# Patient Record
Sex: Male | Born: 1945 | Race: White | Hispanic: No | Marital: Married | Smoking: Never smoker
Health system: Southern US, Community
[De-identification: ages and names within clinical notes are randomized; demographics above are authoritative.]

## PROBLEM LIST (undated history)

## (undated) DIAGNOSIS — C801 Malignant (primary) neoplasm, unspecified: Secondary | ICD-10-CM

## (undated) DIAGNOSIS — T17908A Unspecified foreign body in respiratory tract, part unspecified causing other injury, initial encounter: Secondary | ICD-10-CM

## (undated) DIAGNOSIS — K219 Gastro-esophageal reflux disease without esophagitis: Secondary | ICD-10-CM

## (undated) DIAGNOSIS — E039 Hypothyroidism, unspecified: Secondary | ICD-10-CM

## (undated) HISTORY — PX: GASTROSTOMY TUBE PLACEMENT: SHX655

## (undated) HISTORY — PX: TONSILLECTOMY: SUR1361

## (undated) HISTORY — PX: OTHER SURGICAL HISTORY: SHX169

---

## 2018-09-07 ENCOUNTER — Inpatient Hospital Stay
Admission: EM | Admit: 2018-09-07 | Discharge: 2018-09-09 | DRG: 871 | Disposition: A | Discharge status: Home / Self Care | Payer: Medicare Other | Attending: Specialist | Admitting: Specialist

## 2018-09-07 ENCOUNTER — Encounter: Payer: Self-pay | Admitting: Emergency Medicine

## 2018-09-07 ENCOUNTER — Emergency Department: Payer: Medicare Other

## 2018-09-07 DIAGNOSIS — Z79899 Other long term (current) drug therapy: Secondary | ICD-10-CM

## 2018-09-07 DIAGNOSIS — K219 Gastro-esophageal reflux disease without esophagitis: Secondary | ICD-10-CM | POA: Diagnosis present

## 2018-09-07 DIAGNOSIS — J9601 Acute respiratory failure with hypoxia: Secondary | ICD-10-CM

## 2018-09-07 DIAGNOSIS — J690 Pneumonitis due to inhalation of food and vomit: Secondary | ICD-10-CM

## 2018-09-07 DIAGNOSIS — Z79890 Hormone replacement therapy (postmenopausal): Secondary | ICD-10-CM

## 2018-09-07 DIAGNOSIS — R1310 Dysphagia, unspecified: Secondary | ICD-10-CM | POA: Diagnosis present

## 2018-09-07 DIAGNOSIS — E039 Hypothyroidism, unspecified: Secondary | ICD-10-CM | POA: Diagnosis present

## 2018-09-07 DIAGNOSIS — K224 Dyskinesia of esophagus: Secondary | ICD-10-CM | POA: Diagnosis present

## 2018-09-07 DIAGNOSIS — A419 Sepsis, unspecified organism: Secondary | ICD-10-CM | POA: Diagnosis not present

## 2018-09-07 DIAGNOSIS — E872 Acidosis: Secondary | ICD-10-CM | POA: Diagnosis present

## 2018-09-07 DIAGNOSIS — R55 Syncope and collapse: Secondary | ICD-10-CM | POA: Diagnosis present

## 2018-09-07 DIAGNOSIS — R4702 Dysphasia: Secondary | ICD-10-CM | POA: Diagnosis present

## 2018-09-07 HISTORY — DX: Hypothyroidism, unspecified: E03.9

## 2018-09-07 HISTORY — DX: Gastro-esophageal reflux disease without esophagitis: K21.9

## 2018-09-07 LAB — CBC WITH DIFFERENTIAL/PLATELET
Abs Immature Granulocytes: 0.04 10*3/uL (ref 0.00–0.07)
Basophils Absolute: 0 10*3/uL (ref 0.0–0.1)
Basophils Relative: 0 %
Eosinophils Absolute: 0.3 10*3/uL (ref 0.0–0.5)
Eosinophils Relative: 2 %
HEMATOCRIT: 42 % (ref 39.0–52.0)
Hemoglobin: 13.6 g/dL (ref 13.0–17.0)
Immature Granulocytes: 0 %
LYMPHS PCT: 4 %
Lymphs Abs: 0.6 10*3/uL — ABNORMAL LOW (ref 0.7–4.0)
MCH: 29.1 pg (ref 26.0–34.0)
MCHC: 32.4 g/dL (ref 30.0–36.0)
MCV: 89.9 fL (ref 80.0–100.0)
Monocytes Absolute: 0.3 10*3/uL (ref 0.1–1.0)
Monocytes Relative: 2 %
Neutro Abs: 12.8 10*3/uL — ABNORMAL HIGH (ref 1.7–7.7)
Neutrophils Relative %: 92 %
Platelets: 376 10*3/uL (ref 150–400)
RBC: 4.67 MIL/uL (ref 4.22–5.81)
RDW: 13.4 % (ref 11.5–15.5)
WBC: 14 10*3/uL — AB (ref 4.0–10.5)
nRBC: 0 % (ref 0.0–0.2)

## 2018-09-07 LAB — BASIC METABOLIC PANEL
Anion gap: 10 (ref 5–15)
BUN: 20 mg/dL (ref 8–23)
CO2: 25 mmol/L (ref 22–32)
Calcium: 8.5 mg/dL — ABNORMAL LOW (ref 8.9–10.3)
Chloride: 98 mmol/L (ref 98–111)
Creatinine, Ser: 1.23 mg/dL (ref 0.61–1.24)
GFR calc Af Amer: >60 mL/min (ref >60)
GFR calc non Af Amer: 59 mL/min — ABNORMAL LOW (ref >60)
Glucose, Bld: 127 mg/dL — ABNORMAL HIGH (ref 70–99)
Potassium: 4 mmol/L (ref 3.5–5.1)
Sodium: 133 mmol/L — ABNORMAL LOW (ref 135–145)

## 2018-09-07 LAB — TROPONIN I: Troponin I: <0.03 ng/mL (ref <0.03)

## 2018-09-07 LAB — HEPATIC FUNCTION PANEL
ALT: 19 U/L (ref 0–44)
AST: 31 U/L (ref 15–41)
Albumin: 3.3 g/dL — ABNORMAL LOW (ref 3.5–5.0)
Alkaline Phosphatase: 71 U/L (ref 38–126)
Bilirubin, Direct: 0.2 mg/dL (ref 0.0–0.2)
Indirect Bilirubin: 0.9 mg/dL (ref 0.3–0.9)
Total Bilirubin: 1.1 mg/dL (ref 0.3–1.2)
Total Protein: 8.1 g/dL (ref 6.5–8.1)

## 2018-09-07 LAB — LACTIC ACID, PLASMA: LACTIC ACID, VENOUS: 2.8 mmol/L — AB (ref 0.5–1.9)

## 2018-09-07 LAB — BRAIN NATRIURETIC PEPTIDE: B Natriuretic Peptide: 57 pg/mL (ref 0.0–100.0)

## 2018-09-07 LAB — LIPASE, BLOOD: LIPASE: 29 U/L (ref 11–51)

## 2018-09-07 MED ORDER — SODIUM CHLORIDE 0.9 % IV BOLUS
1000.0000 mL | Freq: Once | INTRAVENOUS | Status: AC
  Administered 2018-09-07: 1000 mL via INTRAVENOUS

## 2018-09-07 MED ORDER — ONDANSETRON HCL 4 MG/2ML IJ SOLN
4.0000 mg | Freq: Once | INTRAMUSCULAR | Status: AC
  Administered 2018-09-07: 4 mg via INTRAVENOUS
  Filled 2018-09-07: qty 2

## 2018-09-07 MED ORDER — SODIUM CHLORIDE 0.9 % IV BOLUS
1000.0000 mL | Freq: Once | INTRAVENOUS | Status: AC
  Administered 2018-09-08: 1000 mL via INTRAVENOUS

## 2018-09-07 NOTE — ED Notes (Signed)
Pt placed on NRB @ 15L for O2 sat 80% on 6L via n/c

## 2018-09-07 NOTE — ED Triage Notes (Signed)
Patient states that he has had 2 syncopal episodes today. Patient states that he has also been vomiting after he passes out. Patient actively vomiting in triage. Patient with complaint of bilateral thigh pain after first syncopal episode.

## 2018-09-07 NOTE — ED Provider Notes (Signed)
[facility] Emergency Department Provider Note ____________________________________________   First MD Initiated Contact with Patient 09/07/18 2305     (approximate)  I have reviewed the triage vital signs and the nursing notes.   HISTORY  Chief Complaint Loss of Consciousness and Emesis    HPI Mark Andrews is a 74 y.o. male with PMH as noted below who presents with shortness of breath, vomiting, and leg pain.  The patient states that around 7 PM this evening he started to have some leg pain.  He then became very lightheaded and syncopized.  Per his wife, he began vomiting during the syncope.  He had a second episode of syncope a short time later, and also further vomiting while in triage here.  Since he passed out, he has been short of breath.  He also reports feeling weak with myalgias since this afternoon, and has had a nonproductive cough for several weeks.   Past Medical History:  Diagnosis Date  . GERD (gastroesophageal reflux disease)   . Hypothyroidism     Patient Active Problem List   Diagnosis Date Noted  . Sepsis ([place]) 09/08/2018    Past Surgical History:  Procedure Laterality Date  . none      Prior to Admission medications   Medication Sig Start Date End Date Taking? Authorizing Provider  levothyroxine (SYNTHROID, LEVOTHROID) 75 MCG tablet Take 75 mcg by mouth daily before breakfast.   Yes [provider]  pantoprazole (PROTONIX) 40 MG tablet Take 40 mg by mouth daily.   Yes [provider]  sodium fluoride (FLUORISHIELD) 1.1 % GEL dental gel Place 1 application onto teeth daily.   Yes [provider]    Allergies Patient has no known allergies.  Family History  Problem Relation Age of Onset  . Pulmonary embolism Mother        and DVT    Social History Social History   Tobacco Use  . Smoking status: Never Smoker  . Smokeless tobacco: Never Used  Substance Use Topics  . Alcohol use: Not on file   . Drug use: Not on file    Review of Systems  Constitutional: Positive for weakness. Eyes: No redness. ENT: No sore throat. Cardiovascular: Denies chest pain. Respiratory: Positive for shortness of breath. Gastrointestinal: Positive for vomiting. Genitourinary: Negative for flank pain.  Musculoskeletal: Positive for leg pain and myalgias. Skin: Negative for rash. Neurological: Negative for headache.   ____________________________________________   PHYSICAL EXAM:  VITAL SIGNS: ED Triage Vitals  Enc Vitals Group     BP 09/07/18 2255 105/71     Pulse Rate 09/07/18 2255 (!) 104     Resp 09/07/18 2255 (!) 26     Temp 09/07/18 2255 98 F (36.7 C)     Temp src --      SpO2 09/07/18 2255 (!) 79 %     Weight 09/07/18 2253 145 lb (65.8 kg)     Height 09/07/18 2253 5\' 10"  (1.778 m)     Head Circumference --      Peak Flow --      Pain Score 09/07/18 2253 8     Pain Loc --      Pain Edu? --      Excl. in [place]? --     Constitutional: Alert and oriented.  Uncomfortable and weak appearing but in no acute distress.   Eyes: Conjunctivae are normal.  Head: Atraumatic. Nose: No congestion/rhinnorhea. Mouth/Throat: Mucous membranes are somewhat dry.   Neck: Normal  range of motion.  Cardiovascular: Borderline tachycardic, regular rhythm. Grossly normal heart sounds.  Good peripheral circulation. Respiratory: Normal respiratory effort.  No retractions.  Slightly coarse breath sounds bilaterally but lungs otherwise clear. Gastrointestinal: No distention.  Genitourinary: No CVA tenderness. Musculoskeletal: No lower extremity edema.  Extremities warm and well perfused.  No calf or popliteal swelling or tenderness. Neurologic:  Normal speech and language. No gross focal neurologic deficits are appreciated.  Skin:  Skin is warm and dry. No rash noted. Psychiatric: Mood and affect are normal. Speech and behavior are normal.  ____________________________________________   LABS (all  labs ordered are listed, but only abnormal results are displayed)  Labs Reviewed  BASIC METABOLIC PANEL - Abnormal; Notable for the following components:      Result Value   Sodium 133 (*)    Glucose, Bld 127 (*)    Calcium 8.5 (*)    GFR calc non Af Amer 59 (*)    All other components within normal limits  CBC WITH DIFFERENTIAL/PLATELET - Abnormal; Notable for the following components:   WBC 14.0 (*)    Neutro Abs 12.8 (*)    Lymphs Abs 0.6 (*)    All other components within normal limits  LACTIC ACID, PLASMA - Abnormal; Notable for the following components:   Lactic Acid, Venous 2.8 (*)    All other components within normal limits  HEPATIC FUNCTION PANEL - Abnormal; Notable for the following components:   Albumin 3.3 (*)    All other components within normal limits  URINALYSIS, COMPLETE (UACMP) WITH MICROSCOPIC - Abnormal; Notable for the following components:   Color, Urine YELLOW (*)    APPearance CLEAR (*)    Specific Gravity, Urine 1.042 (*)    All other components within normal limits  TROPONIN I  LACTIC ACID, PLASMA  BRAIN NATRIURETIC PEPTIDE  LIPASE, BLOOD  INFLUENZA PANEL BY PCR (TYPE A & B)  TSH   ____________________________________________  EKG  ED ECG REPORT I, Arta Silence, the attending physician, personally viewed and interpreted this ECG.  Date: 09/07/2018 EKG Time: 2257 Rate: 101 Rhythm: Sinus tachycardia QRS Axis: Borderline right axis Intervals: normal ST/T Wave abnormalities: normal Narrative Interpretation: no evidence of acute ischemia  ____________________________________________  RADIOLOGY  CXR: Bilateral lower lobe opacity versus atelectasis CT chest: No acute PE ____________________________________________   PROCEDURES  Procedure(s) performed: No  Procedures  Critical Care performed: Yes  CRITICAL CARE Performed by: Arta Silence   Total critical care time: 60 minutes  Critical care time was exclusive of  separately billable procedures and treating other patients.  Critical care was necessary to treat or prevent imminent or life-threatening deterioration.  Critical care was time spent personally by me on the following activities: development of treatment plan with patient and/or surrogate as well as nursing, discussions with consultants, evaluation of patient's response to treatment, examination of patient, obtaining history from patient or surrogate, ordering and performing treatments and interventions, ordering and review of laboratory studies, ordering and review of radiographic studies, pulse oximetry and re-evaluation of patient's condition. ____________________________________________   INITIAL IMPRESSION / ASSESSMENT AND PLAN / ED COURSE  Pertinent labs & imaging results that were available during my care of the patient were reviewed by me and considered in my medical decision making (see chart for details).  73 year old male with no significant past medical history except for GERD presents with syncope x2 this evening associated with vomiting during the syncopal episodes, and with shortness of breath since that time.  The patient  also reports myalgias and bilateral leg pain.  He has had a chronic cough for several weeks.  I reviewed the past medical records in [place] but the patient has no prior visits here.  On exam the patient is alert, oriented, and able to converse normally.  However he does appear weak and somewhat tired.  On arrival his O2 saturation was in the high 70s on room air and his blood pressure was somewhat low.  He was also tachypneic, but afebrile.  The remainder of the exam is as described above.  There is no significant abnormality on his lung exam, his airway is clear, and his neuro exam is nonfocal.  Overall the presentation is most concerning for aspiration.  Symptoms are most consistent with influenza, another viral infection or possible pneumonia,  with subsequent syncope and likely aspiration due to the vomiting.  Given the patient's lack of history and the sequence of the symptoms and relatively unremarkable EKG, I have a low suspicion for cardiac etiology.  Another significant possibility, however, is pulmonary embolism, which would be consistent with the hypoxia, tachycardia, the presence of leg pain, and the syncope.  At this time we have placed the patient on BiPAP for oxygenation because his O2 saturation was only in the low 90s on nonrebreather.  We started fluids and will obtain lab work-up and chest x-ray.  My plan will be that if the chest x-ray does not show significant infiltrate or edema, I will proceed with CT to rule out PE.  ----------------------------------------- 1:40 AM on 09/08/2018 -----------------------------------------  Chest x-ray does show bilateral lower lobe atelectasis versus opacity, which is consistent with an early aspiration.  The patient's labs also reflect likely sepsis with elevated lactate and WBC count.  I started Unasyn for likely aspiration, and the patient is now receiving his third liter.  I was also able to wean the patient off of BiPAP and back onto nasal cannula and he is tolerating this well at this time.  However given the patient's persistent low blood pressure and the factors described above, I think it would be prudent prior to admitting him to rule out PE.  I ordered a CT chest.  I signed the patient out to the hospitalist Dr. Jannifer Franklin at approximately 1:40 AM. ____________________________________________   FINAL CLINICAL IMPRESSION(S) / ED DIAGNOSES  Final diagnoses:  Acute respiratory failure with hypoxia ([place])  Aspiration pneumonia of lower lobe, unspecified aspiration pneumonia type, unspecified laterality ([place])  Syncope, unspecified syncope type      NEW MEDICATIONS STARTED DURING THIS VISIT:  Current Discharge Medication List       Note:  This document was prepared using  Dragon voice recognition software and may include unintentional dictation errors.    Arta Silence, MD 09/08/18 [phone]

## 2018-09-07 NOTE — ED Notes (Signed)
RT paged for biPAP per EDP

## 2018-09-08 ENCOUNTER — Emergency Department: Payer: Medicare Other

## 2018-09-08 ENCOUNTER — Inpatient Hospital Stay
Admit: 2018-09-08 | Discharge: 2018-09-08 | Disposition: A | Discharge status: Home / Self Care | Payer: Medicare Other | Attending: Internal Medicine | Admitting: Internal Medicine

## 2018-09-08 ENCOUNTER — Encounter: Payer: Self-pay | Admitting: Radiology

## 2018-09-08 ENCOUNTER — Other Ambulatory Visit: Payer: Self-pay

## 2018-09-08 DIAGNOSIS — A419 Sepsis, unspecified organism: Secondary | ICD-10-CM | POA: Diagnosis present

## 2018-09-08 DIAGNOSIS — Z79899 Other long term (current) drug therapy: Secondary | ICD-10-CM | POA: Diagnosis not present

## 2018-09-08 DIAGNOSIS — R1310 Dysphagia, unspecified: Secondary | ICD-10-CM | POA: Diagnosis present

## 2018-09-08 DIAGNOSIS — R4702 Dysphasia: Secondary | ICD-10-CM | POA: Diagnosis present

## 2018-09-08 DIAGNOSIS — E872 Acidosis: Secondary | ICD-10-CM | POA: Diagnosis present

## 2018-09-08 DIAGNOSIS — E039 Hypothyroidism, unspecified: Secondary | ICD-10-CM | POA: Diagnosis present

## 2018-09-08 DIAGNOSIS — J690 Pneumonitis due to inhalation of food and vomit: Secondary | ICD-10-CM | POA: Diagnosis present

## 2018-09-08 DIAGNOSIS — K224 Dyskinesia of esophagus: Secondary | ICD-10-CM | POA: Diagnosis present

## 2018-09-08 DIAGNOSIS — J9601 Acute respiratory failure with hypoxia: Secondary | ICD-10-CM | POA: Diagnosis present

## 2018-09-08 DIAGNOSIS — R55 Syncope and collapse: Secondary | ICD-10-CM | POA: Diagnosis present

## 2018-09-08 DIAGNOSIS — K219 Gastro-esophageal reflux disease without esophagitis: Secondary | ICD-10-CM | POA: Diagnosis present

## 2018-09-08 DIAGNOSIS — Z79890 Hormone replacement therapy (postmenopausal): Secondary | ICD-10-CM | POA: Diagnosis not present

## 2018-09-08 LAB — URINALYSIS, COMPLETE (UACMP) WITH MICROSCOPIC
Bacteria, UA: NONE SEEN
Bilirubin Urine: NEGATIVE
Glucose, UA: NEGATIVE mg/dL
Hgb urine dipstick: NEGATIVE
Ketones, ur: NEGATIVE mg/dL
Leukocytes, UA: NEGATIVE
Nitrite: NEGATIVE
Protein, ur: NEGATIVE mg/dL
Specific Gravity, Urine: 1.042 — ABNORMAL HIGH (ref 1.005–1.030)
pH: 7 (ref 5.0–8.0)

## 2018-09-08 LAB — ECHOCARDIOGRAM COMPLETE
Height: 70 in
Weight: 2320 oz

## 2018-09-08 LAB — LACTIC ACID, PLASMA: Lactic Acid, Venous: 1.7 mmol/L (ref 0.5–1.9)

## 2018-09-08 LAB — INFLUENZA PANEL BY PCR (TYPE A & B)
Influenza A By PCR: NEGATIVE
Influenza B By PCR: NEGATIVE

## 2018-09-08 LAB — TSH: TSH: 4.395 u[IU]/mL (ref 0.350–4.500)

## 2018-09-08 MED ORDER — SODIUM CHLORIDE 0.9 % IV SOLN
3.0000 g | Freq: Once | INTRAVENOUS | Status: AC
  Administered 2018-09-08: 3 g via INTRAVENOUS
  Filled 2018-09-08: qty 3

## 2018-09-08 MED ORDER — SODIUM CHLORIDE 0.9 % IV SOLN
INTRAVENOUS | Status: DC
  Administered 2018-09-08 – 2018-09-09 (×3): via INTRAVENOUS

## 2018-09-08 MED ORDER — SODIUM CHLORIDE 0.9 % IV BOLUS
1000.0000 mL | Freq: Once | INTRAVENOUS | Status: AC
  Administered 2018-09-08: 1000 mL via INTRAVENOUS

## 2018-09-08 MED ORDER — SODIUM CHLORIDE 0.9 % IV SOLN
3.0000 g | Freq: Four times a day (QID) | INTRAVENOUS | Status: DC
  Administered 2018-09-08 – 2018-09-09 (×5): 3 g via INTRAVENOUS
  Filled 2018-09-08 (×10): qty 3

## 2018-09-08 MED ORDER — ONDANSETRON HCL 4 MG PO TABS: 4.0000 mg | ORAL_TABLET | Freq: Four times a day (QID) | ORAL | Status: DC | PRN

## 2018-09-08 MED ORDER — ACETAMINOPHEN 650 MG RE SUPP: 650.0000 mg | Freq: Four times a day (QID) | RECTAL | Status: DC | PRN

## 2018-09-08 MED ORDER — GUAIFENESIN 100 MG/5ML PO SOLN
5.0000 mL | ORAL | Status: DC | PRN
  Administered 2018-09-08: 100 mg via ORAL
  Filled 2018-09-08 (×2): qty 5

## 2018-09-08 MED ORDER — PANTOPRAZOLE SODIUM 40 MG PO TBEC
40.0000 mg | DELAYED_RELEASE_TABLET | Freq: Every day | ORAL | Status: DC
  Administered 2018-09-08 – 2018-09-09 (×2): 40 mg via ORAL
  Filled 2018-09-08 (×2): qty 1

## 2018-09-08 MED ORDER — ENOXAPARIN SODIUM 40 MG/0.4ML ~~LOC~~ SOLN
40.0000 mg | SUBCUTANEOUS | Status: DC
  Administered 2018-09-08: 40 mg via SUBCUTANEOUS
  Filled 2018-09-08: qty 0.4

## 2018-09-08 MED ORDER — ONDANSETRON HCL 4 MG/2ML IJ SOLN
4.0000 mg | Freq: Four times a day (QID) | INTRAMUSCULAR | Status: DC | PRN
  Administered 2018-09-09: 4 mg via INTRAVENOUS
  Filled 2018-09-08: qty 2

## 2018-09-08 MED ORDER — ACETAMINOPHEN 325 MG PO TABS: 650.0000 mg | ORAL_TABLET | Freq: Four times a day (QID) | ORAL | Status: DC | PRN

## 2018-09-08 MED ORDER — DOCUSATE SODIUM 100 MG PO CAPS
100.0000 mg | ORAL_CAPSULE | Freq: Two times a day (BID) | ORAL | Status: DC
  Administered 2018-09-08 (×2): 100 mg via ORAL
  Filled 2018-09-08 (×2): qty 1

## 2018-09-08 MED ORDER — IOHEXOL 350 MG/ML SOLN
75.0000 mL | Freq: Once | INTRAVENOUS | Status: AC | PRN
  Administered 2018-09-08: 75 mL via INTRAVENOUS

## 2018-09-08 MED ORDER — LEVOTHYROXINE SODIUM 50 MCG PO TABS
75.0000 ug | ORAL_TABLET | Freq: Every day | ORAL | Status: DC
  Administered 2018-09-08: 75 ug via ORAL
  Filled 2018-09-08: qty 1.5

## 2018-09-08 NOTE — Progress Notes (Signed)
*  PRELIMINARY RESULTS* Echocardiogram 2D Echocardiogram has been performed.  Mark Andrews Kynadi Dragos 09/08/2018, 10:28 AM

## 2018-09-08 NOTE — Progress Notes (Signed)
Pharmacy Antibiotic Note  Mark Andrews is a 73 y.o. male admitted on 09/07/2018 with pneumonia.  Pharmacy has been consulted for Unasyn dosing.  Plan: Unasyn 3 g IV q6h  Height: 5\' 10"  (177.8 cm) Weight: 145 lb (65.8 kg) IBW/kg (Calculated) : 73  Temp (24hrs), Avg:98 F (36.7 C), Min:97.9 F (36.6 C), Max:98.2 F (36.8 C)  Recent Labs  Lab 09/07/18 2258 09/07/18 2309 09/08/18 0133  WBC 14.0*  --   --   CREATININE 1.23  --   --   LATICACIDVEN  --  2.8* 1.7    Estimated Creatinine Clearance: 51.3 mL/min (by C-G formula based on SCr of 1.23 mg/dL).    No Known Allergies  Antimicrobials this admission: Unasyn 1/12 >>   Dose adjustments this admission: NA  Microbiology results:   Thank you for allowing pharmacy to be a part of this patient's care.  Tawnya Crook, PharmD Pharmacy Resident  09/08/2018 1:51 PM

## 2018-09-08 NOTE — Progress Notes (Signed)
Per MD okay for RN to decrease IVF rate to 4ml/hr. Place soft diet order and NPO after midnight,

## 2018-09-08 NOTE — Consult Note (Signed)
Cardiology Consultation Note    Patient ID: Mark Andrews, MRN: [id number], DOB/AGE: February 19, 1946 73 y.o. Admit date: 09/07/2018   Date of Consult: 09/08/2018 Primary Physician: Patient, No Pcp Per Primary Cardiologist:    Chief Complaint: nausea and abdominal pain Reason for Consultation: syncope Requesting MD: Dr. Verdell Carmine  HPI: Mark Andrews is a 73 y.o. male with history of hypothyroidism but no cardiac history with a recent history of gastroesophageal complaints including nausea, vomiting, anorexia.  This is been going on for several weeks.  Worsened recently.  He has difficulty taking p.o. intake.  He is visiting his family in the [place] area from [place].  He was sitting in a chair when he felt lightheaded, somewhat diaphoretic and nauseated followed by a syncopal episode.  He was brought to the emergency room where he ruled out for myocardial infarction.  Electrocardiogram was unremarkable.  Telemetry is unremarkable.  He underwent an echocardiogram revealing no significant structural valvular abnormalities.  He has had no arrhythmia.  His serum creatinine was 1.23 on presentation.  He had no chest pain.  He has had no previous syncopal episodes.  He denies any exertional chest pain rapid or irregular heartbeat.  Past Medical History:  Diagnosis Date  . GERD (gastroesophageal reflux disease)   . Hypothyroidism       Surgical History:  Past Surgical History:  Procedure Laterality Date  . none       Home Meds: Prior to Admission medications   Medication Sig Start Date End Date Taking? Authorizing Provider  levothyroxine (SYNTHROID, LEVOTHROID) 75 MCG tablet Take 75 mcg by mouth daily before breakfast.   Yes [provider]  pantoprazole (PROTONIX) 40 MG tablet Take 40 mg by mouth daily.   Yes [provider]  sodium fluoride (FLUORISHIELD) 1.1 % GEL dental gel Place 1 application onto teeth daily.   Yes [provider]    Inpatient  Medications:  . docusate sodium  100 mg Oral BID  . enoxaparin (LOVENOX) injection  40 mg Subcutaneous Q24H  . levothyroxine  75 mcg Oral QAC breakfast  . pantoprazole  40 mg Oral Daily   . sodium chloride 50 mL/hr at 09/08/18 1513  . ampicillin-sulbactam (UNASYN) IV Stopped (09/08/18 1511)    Allergies: No Known Allergies  Social History   Socioeconomic History  . Marital status: Married    Spouse name: Not on file  . Number of children: Not on file  . Years of education: Not on file  . Highest education level: Not on file  Occupational History  . Not on file  Social Needs  . Financial resource strain: Not on file  . Food insecurity:    Worry: Not on file    Inability: Not on file  . Transportation needs:    Medical: Not on file    Non-medical: Not on file  Tobacco Use  . Smoking status: Never Smoker  . Smokeless tobacco: Never Used  Substance and Sexual Activity  . Alcohol use: Not on file  . Drug use: Not on file  . Sexual activity: Not on file  Lifestyle  . Physical activity:    Days per week: Not on file    Minutes per session: Not on file  . Stress: Not on file  Relationships  . Social connections:    Talks on phone: Not on file    Gets together: Not on file    Attends religious service: Not on file    Active member of club  or organization: Not on file    Attends meetings of clubs or organizations: Not on file    Relationship status: Not on file  . Intimate partner violence:    Fear of current or ex partner: Not on file    Emotionally abused: Not on file    Physically abused: Not on file    Forced sexual activity: Not on file  Other Topics Concern  . Not on file  Social History Narrative  . Not on file     Family History  Problem Relation Age of Onset  . Pulmonary embolism Mother        and DVT     Review of Systems: A 12-system review of systems was performed and is negative except as noted in the HPI.  Labs: Recent Labs    09/07/18 2258   TROPONINI <0.03   Lab Results  Component Value Date   WBC 14.0 (H) 09/07/2018   HGB 13.6 09/07/2018   HCT 42.0 09/07/2018   MCV 89.9 09/07/2018   PLT 376 09/07/2018    Recent Labs  Lab 09/07/18 2258  NA 133*  K 4.0  CL 98  CO2 25  BUN 20  CREATININE 1.23  CALCIUM 8.5*  PROT 8.1  BILITOT 1.1  ALKPHOS 71  ALT 19  AST 31  GLUCOSE 127*   No results found for: CHOL, HDL, LDLCALC, TRIG No results found for: DDIMER  Radiology/Studies:  Ct Angio Chest Pe W And/or Wo Contrast  Result Date: 09/08/2018 CLINICAL DATA:  Syncopal episodes vomiting EXAM: CT ANGIOGRAPHY CHEST WITH CONTRAST TECHNIQUE: Multidetector CT imaging of the chest was performed using the standard protocol during bolus administration of intravenous contrast. Multiplanar CT image reconstructions and MIPs were obtained to evaluate the vascular anatomy. CONTRAST:  85mL OMNIPAQUE IOHEXOL 350 MG/ML SOLN COMPARISON:  Chest x-ray 09/07/2018 FINDINGS: Cardiovascular: Satisfactory opacification of the pulmonary arteries to the segmental level. No evidence of pulmonary embolism. Mild aortic atherosclerosis. No aneurysmal dilatation. Normal heart size. No significant pericardial effusion Mediastinum/Nodes: Midline trachea. No thyroid mass. Multiple enlarged mediastinal lymph nodes. 19 mm right paratracheal lymph node. Subcarinal lymph node measuring 2.5 cm. Right hilar nodes measuring up to 2 cm. Esophagus within normal limits. Lungs/Pleura: Extensive consolidation within the left lower lobe. Partial consolidations and nodularity within the right lower lobe and right middle lobe. 4 mm lingular nodule. No pleural effusion or pneumothorax. Upper Abdomen: No acute abnormality. Musculoskeletal: Age indeterminate mild compression deformity at T3 and mild to moderate compression deformity at T6. Review of the MIP images confirms the above findings. IMPRESSION: 1. Negative for acute pulmonary embolus. 2. Extensive bilateral lower lobe and  right middle lobe consolidations with multiple nodules and nodular foci of airspace disease. Findings suspicious for multifocal pneumonia and infectious/inflammatory nodules. Imaging follow-up to resolution is advised to exclude underlying mass lesions. 3. Moderate-to-marked mediastinal and right hilar adenopathy which may be reactive or secondary to metastatic disease or lymphoproliferative disease. Aortic Atherosclerosis (ICD10-I70.0). Electronically Signed   By: Donavan Foil M.D.   On: 09/08/2018 02:11   Dg Chest Portable 1 View  Result Date: 09/07/2018 CLINICAL DATA:  Two syncopal episodes today. Vomiting, shortness of breath, and cough for several days. EXAM: PORTABLE CHEST 1 VIEW COMPARISON:  None. FINDINGS: Heart size and pulmonary vascularity are normal. Emphysematous changes in the upper lungs. Infiltration in the lung bases could represent atelectasis, pneumonia, or aspiration. No blunting of costophrenic angles. No pneumothorax. Mediastinal contours appear intact. IMPRESSION: Emphysematous changes in the lungs.  Infiltration or atelectasis in the lung bases. Electronically Signed   By: Lucienne Capers M.D.   On: 09/07/2018 23:54    Wt Readings from Last 3 Encounters:  09/07/18 65.8 kg    EKG: Normal sinus rhythm with no ischemic changes.  Physical Exam:  Blood pressure 136/80, pulse 89, temperature 97.9 F (36.6 C), temperature source Oral, resp. rate 18, height 5\' 10"  (1.778 m), weight 65.8 kg, SpO2 94 %. Body mass index is 20.81 kg/m. General: Well developed, well nourished, in no acute distress. Head: Normocephalic, atraumatic, sclera non-icteric, no xanthomas, nares are without discharge.  Neck: Negative for carotid bruits. JVD not elevated. Lungs: Clear bilaterally to auscultation without wheezes, rales, or rhonchi. Breathing is unlabored. Heart: RRR with S1 S2. No murmurs, rubs, or gallops appreciated. Abdomen: Soft, non-tender, non-distended with normoactive bowel sounds. No  hepatomegaly. No rebound/guarding. No obvious abdominal masses. Msk:  Strength and tone appear normal for age. Extremities: No clubbing or cyanosis. No edema.  Distal pedal pulses are 2+ and equal bilaterally. Neuro: Alert and oriented X 3. No facial asymmetry. No focal deficit. Moves all extremities spontaneously. Psych:  Responds to questions appropriately with a normal affect.     Assessment and Plan  73 year old male with a history of nausea and vomiting as well as anorexia.  He was admitted after a syncopal episode occurring while seated.  He was somewhat diaphoretic and mildly nauseated during the event.  He has had no prior history of this.  He has no chest pain.  He has had no dysrhythmias since admission.  EKG showed no ischemia.  Echo showed normal LV function with no significant structural valvular abnormalities.  Etiology of syncope is likely relative volume depletion in the face of his GI distress.  Will follow on telemetry for arrhythmia however thus far there is been none.  After discharge when stable from a GI standpoint, event monitor as well as possible ischemic work-up could be considered however would defer any functional study other ischemic work-up at present as this picture does not appear to be related to active ischemia.  Careful hydration.  Signed, Teodoro Spray MD 09/08/2018, 4:59 PM Pager: [phone]

## 2018-09-08 NOTE — H&P (Signed)
Mark Andrews is an 73 y.o. male.   Chief Complaint: Syncope HPI: The patient with past medical history of GERD and hypothyroidism presents to the emergency department after 2 episodes of syncope.  The patient was sitting in a chair when he lost consciousness.  He did not fall from the chair or harm himself in any way.  He did have 2 episodes of nonbloody nonbilious emesis.  Due to hypoxia of 80% in route the patient required supplemental oxygen via nonrebreather mask.  This x-ray showed likely aspiration pneumonia.  Patient was started on Unasyn and eventually transition to 6 L of oxygen via nasal cannula prior to the emergency department staff calling the hospitalist service for admission.  Past Medical History:  Diagnosis Date  . GERD (gastroesophageal reflux disease)   . Hypothyroidism     Past Surgical History:  Procedure Laterality Date  . none      Family History  Problem Relation Age of Onset  . Pulmonary embolism Mother        and DVT   Social History:  reports that he has never smoked. He has never used smokeless tobacco. No history on file for alcohol and drug.  Allergies: No Known Allergies  Medications Prior to Admission  Medication Sig Dispense Refill  . levothyroxine (SYNTHROID, LEVOTHROID) 75 MCG tablet Take 75 mcg by mouth daily before breakfast.    . pantoprazole (PROTONIX) 40 MG tablet Take 40 mg by mouth daily.    . sodium fluoride (FLUORISHIELD) 1.1 % GEL dental gel Place 1 application onto teeth daily.      Results for orders placed or performed during the hospital encounter of 09/07/18 (from the past 48 hour(s))  Basic metabolic panel     Status: Abnormal   Collection Time: 09/07/18 10:58 PM  Result Value Ref Range   Sodium 133 (L) 135 - 145 mmol/L   Potassium 4.0 3.5 - 5.1 mmol/L   Chloride 98 98 - 111 mmol/L   CO2 25 22 - 32 mmol/L   Glucose, Bld 127 (H) 70 - 99 mg/dL   BUN 20 8 - 23 mg/dL   Creatinine, Ser 1.23 0.61 - 1.24 mg/dL   Calcium 8.5 (L) 8.9  - 10.3 mg/dL   GFR calc non Af Amer 59 (L) >60 mL/min   GFR calc Af Amer >60 >60 mL/min   Anion gap 10 5 - 15    Comment: Performed at [facility], [address] [place], [place] [address]  CBC with Differential     Status: Abnormal   Collection Time: 09/07/18 10:58 PM  Result Value Ref Range   WBC 14.0 (H) 4.0 - 10.5 K/uL   RBC 4.67 4.22 - 5.81 MIL/uL   Hemoglobin 13.6 13.0 - 17.0 g/dL   HCT 42.0 39.0 - 52.0 %   MCV 89.9 80.0 - 100.0 fL   MCH 29.1 26.0 - 34.0 pg   MCHC 32.4 30.0 - 36.0 g/dL   RDW 13.4 11.5 - 15.5 %   Platelets 376 150 - 400 K/uL   nRBC 0.0 0.0 - 0.2 %   Neutrophils Relative % 92 %   Neutro Abs 12.8 (H) 1.7 - 7.7 K/uL   Lymphocytes Relative 4 %   Lymphs Abs 0.6 (L) 0.7 - 4.0 K/uL   Monocytes Relative 2 %   Monocytes Absolute 0.3 0.1 - 1.0 K/uL   Eosinophils Relative 2 %   Eosinophils Absolute 0.3 0.0 - 0.5 K/uL   Basophils Relative 0 %  Basophils Absolute 0.0 0.0 - 0.1 K/uL   Immature Granulocytes 0 %   Abs Immature Granulocytes 0.04 0.00 - 0.07 K/uL    Comment: Performed at [facility], [address] [place], [place] [address]  Troponin I - Once     Status: None   Collection Time: 09/07/18 10:58 PM  Result Value Ref Range   Troponin I <0.03 <0.03 ng/mL    Comment: Performed at [facility], [address] [place], [place] [address]  Brain natriuretic peptide     Status: None   Collection Time: 09/07/18 10:58 PM  Result Value Ref Range   B Natriuretic Peptide 57.0 0.0 - 100.0 pg/mL    Comment: Performed at [facility], [address] [place], [place] [address]  Lipase, blood     Status: None   Collection Time: 09/07/18 10:58 PM  Result Value Ref Range   Lipase 29 11 - 51 U/L    Comment: Performed at [facility], [address] [place], [place] [address]  Hepatic function panel     Status: Abnormal   Collection Time: 09/07/18 10:58 PM  Result Value Ref Range   Total Protein 8.1 6.5 - 8.1  g/dL   Albumin 3.3 (L) 3.5 - 5.0 g/dL   AST 31 15 - 41 U/L   ALT 19 0 - 44 U/L   Alkaline Phosphatase 71 38 - 126 U/L   Total Bilirubin 1.1 0.3 - 1.2 mg/dL   Bilirubin, Direct 0.2 0.0 - 0.2 mg/dL   Indirect Bilirubin 0.9 0.3 - 0.9 mg/dL    Comment: Performed at [facility], [address] [place], [place] [address]  Influenza panel by PCR (type A & B)     Status: None   Collection Time: 09/07/18 10:58 PM  Result Value Ref Range   Influenza A By PCR NEGATIVE NEGATIVE   Influenza B By PCR NEGATIVE NEGATIVE    Comment: (NOTE) The Xpert Xpress Flu assay is intended as an aid in the diagnosis of  influenza and should not be used as a sole basis for treatment.  This  assay is FDA approved for nasopharyngeal swab specimens only. Nasal  washings and aspirates are unacceptable for Xpert Xpress Flu testing. Performed at [facility], [address] [place], [place] [address]   TSH     Status: None   Collection Time: 09/07/18 10:58 PM  Result Value Ref Range   TSH 4.395 0.350 - 4.500 uIU/mL    Comment: Performed by a 3rd Generation assay with a functional sensitivity of <=0.01 uIU/mL. Performed at [facility], [place], [place] [address]   Lactic acid, plasma     Status: Abnormal   Collection Time: 09/07/18 11:09 PM  Result Value Ref Range   Lactic Acid, Venous 2.8 (HH) 0.5 - 1.9 mmol/L    Comment: CRITICAL RESULT CALLED TO, READ BACK BY AND VERIFIED WITH REBECCA LYNN ON 09/07/18 AT 2355 BY JAG Performed at [facility], [address] [place],  [address]   Lactic acid, plasma     Status: None   Collection Time: 09/08/18  1:33 AM  Result Value Ref Range   Lactic Acid, Venous 1.7 0.5 - 1.9 mmol/L    Comment: Performed at [facility], [address] [place],  [address]  Urinalysis, Complete w Microscopic     Status: Abnormal   Collection Time: 09/08/18  1:33 AM  Result Value Ref Range  Color, Urine  YELLOW (A) YELLOW   APPearance CLEAR (A) CLEAR   Specific Gravity, Urine 1.042 (H) 1.005 - 1.030   pH 7.0 5.0 - 8.0   Glucose, UA NEGATIVE NEGATIVE mg/dL   Hgb urine dipstick NEGATIVE NEGATIVE   Bilirubin Urine NEGATIVE NEGATIVE   Ketones, ur NEGATIVE NEGATIVE mg/dL   Protein, ur NEGATIVE NEGATIVE mg/dL   Nitrite NEGATIVE NEGATIVE   Leukocytes, UA NEGATIVE NEGATIVE   RBC / HPF 0-5 0 - 5 RBC/hpf   WBC, UA 0-5 0 - 5 WBC/hpf   Bacteria, UA NONE SEEN NONE SEEN   Squamous Epithelial / LPF 0-5 0 - 5   Mucus PRESENT     Comment: Performed at [facility], [place], [place] [address]   Ct Angio Chest Pe W And/or Wo Contrast  Result Date: 09/08/2018 CLINICAL DATA:  Syncopal episodes vomiting EXAM: CT ANGIOGRAPHY CHEST WITH CONTRAST TECHNIQUE: Multidetector CT imaging of the chest was performed using the standard protocol during bolus administration of intravenous contrast. Multiplanar CT image reconstructions and MIPs were obtained to evaluate the vascular anatomy. CONTRAST:  70mL OMNIPAQUE IOHEXOL 350 MG/ML SOLN COMPARISON:  Chest x-ray 09/07/2018 FINDINGS: Cardiovascular: Satisfactory opacification of the pulmonary arteries to the segmental level. No evidence of pulmonary embolism. Mild aortic atherosclerosis. No aneurysmal dilatation. Normal heart size. No significant pericardial effusion Mediastinum/Nodes: Midline trachea. No thyroid mass. Multiple enlarged mediastinal lymph nodes. 19 mm right paratracheal lymph node. Subcarinal lymph node measuring 2.5 cm. Right hilar nodes measuring up to 2 cm. Esophagus within normal limits. Lungs/Pleura: Extensive consolidation within the left lower lobe. Partial consolidations and nodularity within the right lower lobe and right middle lobe. 4 mm lingular nodule. No pleural effusion or pneumothorax. Upper Abdomen: No acute abnormality. Musculoskeletal: Age indeterminate mild compression deformity at T3 and mild to moderate compression  deformity at T6. Review of the MIP images confirms the above findings. IMPRESSION: 1. Negative for acute pulmonary embolus. 2. Extensive bilateral lower lobe and right middle lobe consolidations with multiple nodules and nodular foci of airspace disease. Findings suspicious for multifocal pneumonia and infectious/inflammatory nodules. Imaging follow-up to resolution is advised to exclude underlying mass lesions. 3. Moderate-to-marked mediastinal and right hilar adenopathy which may be reactive or secondary to metastatic disease or lymphoproliferative disease. Aortic Atherosclerosis (ICD10-I70.0). Electronically Signed   By: Donavan Foil M.D.   On: 09/08/2018 02:11   Dg Chest Portable 1 View  Result Date: 09/07/2018 CLINICAL DATA:  Two syncopal episodes today. Vomiting, shortness of breath, and cough for several days. EXAM: PORTABLE CHEST 1 VIEW COMPARISON:  None. FINDINGS: Heart size and pulmonary vascularity are normal. Emphysematous changes in the upper lungs. Infiltration in the lung bases could represent atelectasis, pneumonia, or aspiration. No blunting of costophrenic angles. No pneumothorax. Mediastinal contours appear intact. IMPRESSION: Emphysematous changes in the lungs. Infiltration or atelectasis in the lung bases. Electronically Signed   By: Lucienne Capers M.D.   On: 09/07/2018 23:54    Review of Systems  Constitutional: Negative for chills and fever.  HENT: Negative for sore throat and tinnitus.   Eyes: Negative for blurred vision and redness.  Respiratory: Negative for cough and shortness of breath.   Cardiovascular: Negative for chest pain, palpitations, orthopnea and PND.  Gastrointestinal: Positive for nausea and vomiting. Negative for abdominal pain and diarrhea.  Genitourinary: Negative for dysuria, frequency and urgency.  Musculoskeletal: Negative for joint pain and myalgias.  Skin: Negative for rash.       No lesions  Neurological: Positive for loss of consciousness.  Negative for speech change, focal weakness and weakness.  Endo/Heme/Allergies: Does not bruise/bleed easily.       No temperature intolerance  Psychiatric/Behavioral: Negative for depression and suicidal ideas.    Blood pressure 93/70, pulse 92, temperature 98.2 F (36.8 C), temperature source Oral, resp. rate (!) 24, height 5\' 10"  (1.778 m), weight 65.8 kg, SpO2 94 %. Physical Exam  Vitals reviewed. Constitutional: He is oriented to person, place, and time. He appears well-developed and well-nourished. No distress.  HENT:  Head: Normocephalic and atraumatic.  Mouth/Throat: Oropharynx is clear and moist.  Eyes: Pupils are equal, round, and reactive to light. Conjunctivae and EOM are normal. No scleral icterus.  Neck: Normal range of motion. Neck supple. No JVD present. No tracheal deviation present. No thyromegaly present.  Cardiovascular: Normal rate, regular rhythm and normal heart sounds. Exam reveals no gallop and no friction rub.  No murmur heard. Respiratory: Effort normal and breath sounds normal. No respiratory distress.  GI: Soft. Bowel sounds are normal. He exhibits no distension. There is no abdominal tenderness.  Genitourinary:    Genitourinary Comments: Deferred   Musculoskeletal: Normal range of motion.        General: No edema.  Lymphadenopathy:    He has no cervical adenopathy.  Neurological: He is alert and oriented to person, place, and time. No cranial nerve deficit.  Skin: Skin is warm and dry. No rash noted. No erythema.  Psychiatric: He has a normal mood and affect. His behavior is normal. Judgment and thought content normal.     Assessment/Plan This is a 73 year old male admitted for sepsis. 1.  Sepsis: The patient meets criteria via tachycardia, leukocytosis and tachypnea.  Lactic acidosis also present.  He is hemodynamically stable.  Some recorded blood pressures have been somewhat low but have either been inaccurate or fluid responsive.  Continue aggressive  volume resuscitation as well as Unasyn for aspiration pneumonia.  Follow blood cultures as well as sputum sample if possible for growth and sensitivities. 2.  Pneumonia: Likely secondary to aspiration.  Antibiotics as above. 3.  Syncope: Differential diagnosis of etiology includes labile blood pressure versus arrhythmia.  Obtain echocardiogram.  Consult cardiology and neurology if necessary. 4.  Hypothyroidism: Check TSH; continue Synthroid 5.  DVT prophylaxis: Lovenox 6.  GI prophylaxis: Pantoprazole per home regimen The patient is a full code.  Time spent on admission orders and patient care approximately 45 minutes  Harrie Foreman, MD 09/08/2018, 6:32 AM

## 2018-09-08 NOTE — ED Notes (Signed)
ED TO INPATIENT HANDOFF REPORT  Name/Age/Gender Mark Andrews 73 y.o. male  Code Status   Home/SNF/Other Home  Chief Complaint Passed out/vomiting  Level of Care/Admitting Diagnosis ED Disposition    ED Disposition Condition [place]: [place] [[id number]]  Level of Care: Med-Surg [16]  Diagnosis: Sepsis [employer]) [[id number]]  Admitting Physician: Harrie Foreman [[id number]]  Attending Physician: Harrie Foreman [[id number]]  Estimated length of stay: past midnight tomorrow  Certification:: I certify this patient will need inpatient services for at least 2 midnights  PT Class (Do Not Modify): Inpatient [101]  PT Acc Code (Do Not Modify): Private [1]       Medical History Past Medical History:  Diagnosis Date  . GERD (gastroesophageal reflux disease)     Allergies No Known Allergies  IV Location/Drains/Wounds Patient Lines/Drains/Airways Status   Active Line/Drains/Airways    Name:   Placement date:   Placement time:   Site:   Days:   Peripheral IV 09/07/18 Right Antecubital   09/07/18    2301    Antecubital   1   Peripheral IV 09/08/18 Left Forearm   09/08/18    0000    Forearm   less than 1          Labs/Imaging Results for orders placed or performed during the hospital encounter of 09/07/18 (from the past 48 hour(s))  Basic metabolic panel     Status: Abnormal   Collection Time: 09/07/18 10:58 PM  Result Value Ref Range   Sodium 133 (L) 135 - 145 mmol/L   Potassium 4.0 3.5 - 5.1 mmol/L   Chloride 98 98 - 111 mmol/L   CO2 25 22 - 32 mmol/L   Glucose, Bld 127 (H) 70 - 99 mg/dL   BUN 20 8 - 23 mg/dL   Creatinine, Ser 1.23 0.61 - 1.24 mg/dL   Calcium 8.5 (L) 8.9 - 10.3 mg/dL   GFR calc non Af Amer 59 (L) >60 mL/min   GFR calc Af Amer >60 >60 mL/min   Anion gap 10 5 - 15    Comment: Performed at [facility], [address] [place], [place] [address]  CBC with Differential     Status: Abnormal    Collection Time: 09/07/18 10:58 PM  Result Value Ref Range   WBC 14.0 (H) 4.0 - 10.5 K/uL   RBC 4.67 4.22 - 5.81 MIL/uL   Hemoglobin 13.6 13.0 - 17.0 g/dL   HCT 42.0 39.0 - 52.0 %   MCV 89.9 80.0 - 100.0 fL   MCH 29.1 26.0 - 34.0 pg   MCHC 32.4 30.0 - 36.0 g/dL   RDW 13.4 11.5 - 15.5 %   Platelets 376 150 - 400 K/uL   nRBC 0.0 0.0 - 0.2 %   Neutrophils Relative % 92 %   Neutro Abs 12.8 (H) 1.7 - 7.7 K/uL   Lymphocytes Relative 4 %   Lymphs Abs 0.6 (L) 0.7 - 4.0 K/uL   Monocytes Relative 2 %   Monocytes Absolute 0.3 0.1 - 1.0 K/uL   Eosinophils Relative 2 %   Eosinophils Absolute 0.3 0.0 - 0.5 K/uL   Basophils Relative 0 %   Basophils Absolute 0.0 0.0 - 0.1 K/uL   Immature Granulocytes 0 %   Abs Immature Granulocytes 0.04 0.00 - 0.07 K/uL    Comment: Performed at [facility], [address] [place], [place] [address]  Troponin I - Once     Status:  None   Collection Time: 09/07/18 10:58 PM  Result Value Ref Range   Troponin I <0.03 <0.03 ng/mL    Comment: Performed at [facility], [address] [place], [place] [address]  Brain natriuretic peptide     Status: None   Collection Time: 09/07/18 10:58 PM  Result Value Ref Range   B Natriuretic Peptide 57.0 0.0 - 100.0 pg/mL    Comment: Performed at [facility], [address] [place], [place] [address]  Lipase, blood     Status: None   Collection Time: 09/07/18 10:58 PM  Result Value Ref Range   Lipase 29 11 - 51 U/L    Comment: Performed at [facility], [address] [place], [place] [address]  Hepatic function panel     Status: Abnormal   Collection Time: 09/07/18 10:58 PM  Result Value Ref Range   Total Protein 8.1 6.5 - 8.1 g/dL   Albumin 3.3 (L) 3.5 - 5.0 g/dL   AST 31 15 - 41 U/L   ALT 19 0 - 44 U/L   Alkaline Phosphatase 71 38 - 126 U/L   Total Bilirubin 1.1 0.3 - 1.2 mg/dL   Bilirubin, Direct 0.2 0.0 - 0.2 mg/dL   Indirect Bilirubin 0.9 0.3 - 0.9 mg/dL    Comment:  Performed at [facility], [address] [place], [place] [address]  Influenza panel by PCR (type A & B)     Status: None   Collection Time: 09/07/18 10:58 PM  Result Value Ref Range   Influenza A By PCR NEGATIVE NEGATIVE   Influenza B By PCR NEGATIVE NEGATIVE    Comment: (NOTE) The Xpert Xpress Flu assay is intended as an aid in the diagnosis of  influenza and should not be used as a sole basis for treatment.  This  assay is FDA approved for nasopharyngeal swab specimens only. Nasal  washings and aspirates are unacceptable for Xpert Xpress Flu testing. Performed at [facility], [address] [place], [place] [address]   Lactic acid, plasma     Status: Abnormal   Collection Time: 09/07/18 11:09 PM  Result Value Ref Range   Lactic Acid, Venous 2.8 (HH) 0.5 - 1.9 mmol/L    Comment: CRITICAL RESULT CALLED TO, READ BACK BY AND VERIFIED WITH Gautham Hewins ON 09/07/18 AT 2355 BY JAG Performed at [facility], [address] [place], [place] [address]   Lactic acid, plasma     Status: None   Collection Time: 09/08/18  1:33 AM  Result Value Ref Range   Lactic Acid, Venous 1.7 0.5 - 1.9 mmol/L    Comment: Performed at [facility], [address] [place], [place] [address]  Urinalysis, Complete w Microscopic     Status: Abnormal   Collection Time: 09/08/18  1:33 AM  Result Value Ref Range   Color, Urine YELLOW (A) YELLOW   APPearance CLEAR (A) CLEAR   Specific Gravity, Urine 1.042 (H) 1.005 - 1.030   pH 7.0 5.0 - 8.0   Glucose, UA NEGATIVE NEGATIVE mg/dL   Hgb urine dipstick NEGATIVE NEGATIVE   Bilirubin Urine NEGATIVE NEGATIVE   Ketones, ur NEGATIVE NEGATIVE mg/dL   Protein, ur NEGATIVE NEGATIVE mg/dL   Nitrite NEGATIVE NEGATIVE   Leukocytes, UA NEGATIVE NEGATIVE   RBC / HPF 0-5 0 - 5 RBC/hpf   WBC, UA 0-5 0 - 5 WBC/hpf   Bacteria, UA NONE SEEN NONE SEEN   Squamous Epithelial / LPF 0-5  0 - 5   Mucus PRESENT     Comment: Performed at  [facility], [place], [place] [address]   Ct Angio Chest Pe W And/or Wo Contrast  Result Date: 09/08/2018 CLINICAL DATA:  Syncopal episodes vomiting EXAM: CT ANGIOGRAPHY CHEST WITH CONTRAST TECHNIQUE: Multidetector CT imaging of the chest was performed using the standard protocol during bolus administration of intravenous contrast. Multiplanar CT image reconstructions and MIPs were obtained to evaluate the vascular anatomy. CONTRAST:  33mL OMNIPAQUE IOHEXOL 350 MG/ML SOLN COMPARISON:  Chest x-ray 09/07/2018 FINDINGS: Cardiovascular: Satisfactory opacification of the pulmonary arteries to the segmental level. No evidence of pulmonary embolism. Mild aortic atherosclerosis. No aneurysmal dilatation. Normal heart size. No significant pericardial effusion Mediastinum/Nodes: Midline trachea. No thyroid mass. Multiple enlarged mediastinal lymph nodes. 19 mm right paratracheal lymph node. Subcarinal lymph node measuring 2.5 cm. Right hilar nodes measuring up to 2 cm. Esophagus within normal limits. Lungs/Pleura: Extensive consolidation within the left lower lobe. Partial consolidations and nodularity within the right lower lobe and right middle lobe. 4 mm lingular nodule. No pleural effusion or pneumothorax. Upper Abdomen: No acute abnormality. Musculoskeletal: Age indeterminate mild compression deformity at T3 and mild to moderate compression deformity at T6. Review of the MIP images confirms the above findings. IMPRESSION: 1. Negative for acute pulmonary embolus. 2. Extensive bilateral lower lobe and right middle lobe consolidations with multiple nodules and nodular foci of airspace disease. Findings suspicious for multifocal pneumonia and infectious/inflammatory nodules. Imaging follow-up to resolution is advised to exclude underlying mass lesions. 3. Moderate-to-marked mediastinal and right hilar adenopathy which may be reactive or secondary to metastatic disease or lymphoproliferative  disease. Aortic Atherosclerosis (ICD10-I70.0). Electronically Signed   By: Donavan Foil M.D.   On: 09/08/2018 02:11   Dg Chest Portable 1 View  Result Date: 09/07/2018 CLINICAL DATA:  Two syncopal episodes today. Vomiting, shortness of breath, and cough for several days. EXAM: PORTABLE CHEST 1 VIEW COMPARISON:  None. FINDINGS: Heart size and pulmonary vascularity are normal. Emphysematous changes in the upper lungs. Infiltration in the lung bases could represent atelectasis, pneumonia, or aspiration. No blunting of costophrenic angles. No pneumothorax. Mediastinal contours appear intact. IMPRESSION: Emphysematous changes in the lungs. Infiltration or atelectasis in the lung bases. Electronically Signed   By: Lucienne Capers M.D.   On: 09/07/2018 23:54    Pending Labs [employer] (From admission, onward)    Start     Ordered   Signed and Held  Creatinine, serum  (enoxaparin (LOVENOX)    CrCl >/= 30 ml/min)  Weekly,   R    Comments:  while on enoxaparin therapy    Signed and Held   Signed and Held  TSH  Add-on,   R     Signed and Held          Vitals/Pain Today's Vitals   09/08/18 0203 09/08/18 0205 09/08/18 0215 09/08/18 0230  BP: 98/71  96/70 101/74  Pulse: 87  87 89  Resp: (!) 23  (!) 24 (!) 27  Temp:      SpO2: 94%  97% 96%  Weight:      Height:      PainSc:  0-No pain      Isolation Precautions Droplet precaution  Medications Medications  sodium chloride 0.9 % bolus 1,000 mL (0 mLs Intravenous Stopped 09/08/18 0011)  ondansetron (ZOFRAN) injection 4 mg (4 mg Intravenous Given 09/07/18 2316)  sodium chloride 0.9 % bolus 1,000 mL (0 mLs Intravenous Stopped 09/08/18  0112)  Ampicillin-Sulbactam (UNASYN) 3 g in sodium chloride 0.9 % 100 mL IVPB (0 g Intravenous Stopped 09/08/18 0206)  iohexol (OMNIPAQUE) 350 MG/ML injection 75 mL (75 mLs Intravenous Contrast Given 09/08/18 0144)  sodium chloride 0.9 % bolus 1,000 mL (0 mLs Intravenous Stopped 09/08/18 0252)     Mobility walks

## 2018-09-08 NOTE — ED Notes (Signed)
MD Siadecki at bedside 

## 2018-09-08 NOTE — Progress Notes (Signed)
[facility] [place] NAME: Mark Andrews    MR#:  [id number]  DATE OF BIRTH:  1946-02-24  SUBJECTIVE:   Patient presented to the hospital due to syncope.  He also has ongoing recurrent dysphasia.  Had a CT chest which was suggestive of pneumonia/aspiration pneumonia.  Patient presently admits to a cough and congestion but no fever, chills.  Denies any worsening shortness of breath.  REVIEW OF SYSTEMS:    Review of Systems  Constitutional: Negative for chills and fever.  HENT: Negative for congestion and tinnitus.   Eyes: Negative for blurred vision and double vision.  Respiratory: Positive for cough. Negative for shortness of breath and wheezing.   Cardiovascular: Negative for chest pain, orthopnea and PND.  Gastrointestinal: Negative for abdominal pain, diarrhea, nausea and vomiting.  Genitourinary: Negative for dysuria and hematuria.  Neurological: Negative for dizziness, sensory change and focal weakness.  All other systems reviewed and are negative.   Nutrition: Soft diet Tolerating Diet: Yes Tolerating PT: Await Eval.   DRUG ALLERGIES:  No Known Allergies  VITALS:  Blood pressure 93/70, pulse 92, temperature 98.2 F (36.8 C), temperature source Oral, resp. rate (!) 24, height 5\' 10"  (1.778 m), weight 65.8 kg, SpO2 94 %.  PHYSICAL EXAMINATION:   Physical Exam  GENERAL:  73 y.o.-year-old patient lying in bed in no acute distress.  EYES: Pupils equal, round, reactive to light and accommodation. No scleral icterus. Extraocular muscles intact.  HEENT: Head atraumatic, normocephalic. Oropharynx and nasopharynx clear.  NECK:  Supple, no jugular venous distention. No thyroid enlargement, no tenderness.  LUNGS: Normal breath sounds bilaterally, no wheezing, rales, rhonchi. No use of accessory muscles of respiration.  CARDIOVASCULAR: S1, S2 normal. No murmurs, rubs, or gallops.  ABDOMEN: Soft, nontender, nondistended. Bowel sounds  present. No organomegaly or mass.  EXTREMITIES: No cyanosis, clubbing or edema b/l.    NEUROLOGIC: Cranial nerves II through XII are intact. No focal Motor or sensory deficits b/l.   PSYCHIATRIC: The patient is alert and oriented x 3.  SKIN: No obvious rash, lesion, or ulcer.    LABORATORY PANEL:   CBC Recent Labs  Lab 09/07/18 2258  WBC 14.0*  HGB 13.6  HCT 42.0  PLT 376   ------------------------------------------------------------------------------------------------------------------  Chemistries  Recent Labs  Lab 09/07/18 2258  NA 133*  K 4.0  CL 98  CO2 25  GLUCOSE 127*  BUN 20  CREATININE 1.23  CALCIUM 8.5*  AST 31  ALT 19  ALKPHOS 71  BILITOT 1.1   ------------------------------------------------------------------------------------------------------------------  Cardiac Enzymes Recent Labs  Lab 09/07/18 2258  TROPONINI <0.03   ------------------------------------------------------------------------------------------------------------------  RADIOLOGY:  Ct Angio Chest Pe W And/or Wo Contrast  Result Date: 09/08/2018 CLINICAL DATA:  Syncopal episodes vomiting EXAM: CT ANGIOGRAPHY CHEST WITH CONTRAST TECHNIQUE: Multidetector CT imaging of the chest was performed using the standard protocol during bolus administration of intravenous contrast. Multiplanar CT image reconstructions and MIPs were obtained to evaluate the vascular anatomy. CONTRAST:  18mL OMNIPAQUE IOHEXOL 350 MG/ML SOLN COMPARISON:  Chest x-ray 09/07/2018 FINDINGS: Cardiovascular: Satisfactory opacification of the pulmonary arteries to the segmental level. No evidence of pulmonary embolism. Mild aortic atherosclerosis. No aneurysmal dilatation. Normal heart size. No significant pericardial effusion Mediastinum/Nodes: Midline trachea. No thyroid mass. Multiple enlarged mediastinal lymph nodes. 19 mm right paratracheal lymph node. Subcarinal lymph node measuring 2.5 cm. Right hilar nodes measuring up to  2 cm. Esophagus within normal limits. Lungs/Pleura: Extensive consolidation within the left  lower lobe. Partial consolidations and nodularity within the right lower lobe and right middle lobe. 4 mm lingular nodule. No pleural effusion or pneumothorax. Upper Abdomen: No acute abnormality. Musculoskeletal: Age indeterminate mild compression deformity at T3 and mild to moderate compression deformity at T6. Review of the MIP images confirms the above findings. IMPRESSION: 1. Negative for acute pulmonary embolus. 2. Extensive bilateral lower lobe and right middle lobe consolidations with multiple nodules and nodular foci of airspace disease. Findings suspicious for multifocal pneumonia and infectious/inflammatory nodules. Imaging follow-up to resolution is advised to exclude underlying mass lesions. 3. Moderate-to-marked mediastinal and right hilar adenopathy which may be reactive or secondary to metastatic disease or lymphoproliferative disease. Aortic Atherosclerosis (ICD10-I70.0). Electronically Signed   By: Donavan Foil M.D.   On: 09/08/2018 02:11   Dg Chest Portable 1 View  Result Date: 09/07/2018 CLINICAL DATA:  Two syncopal episodes today. Vomiting, shortness of breath, and cough for several days. EXAM: PORTABLE CHEST 1 VIEW COMPARISON:  None. FINDINGS: Heart size and pulmonary vascularity are normal. Emphysematous changes in the upper lungs. Infiltration in the lung bases could represent atelectasis, pneumonia, or aspiration. No blunting of costophrenic angles. No pneumothorax. Mediastinal contours appear intact. IMPRESSION: Emphysematous changes in the lungs. Infiltration or atelectasis in the lung bases. Electronically Signed   By: Lucienne Capers M.D.   On: 09/07/2018 23:54     ASSESSMENT AND PLAN:   73 year old male with past medical history of hypothyroidism, GERD who is currently visiting from out of town presented to the hospital due to syncope.  1.  Syncope- etiology unclear.  Patient had  very vague prodromal symptoms prior to his syncopal episode complaining of feeling hot and his legs hurting.  No acute cardiac or neurologic symptoms. - Questionable cough induced syncope as patient has ongoing dysphasia/reflux and is currently being worked up for it. - Observe on telemetry, no arrhythmias presently, will get cardiology consult await echocardiogram results.  2.  Dysphagia-patient is apparently had intermittent dysphagia ongoing for the past 3 months he is currently being worked up in [place] for it.  He is scheduled to have an upper GI study and a gastric emptying study in the next few weeks. -We will get upper GI series tomorrow.  Get speech/swallow evaluation in a.m.  3.  Aspiration pneumonia-suspected based on his CT scan respiratory symptoms. -Continue IV Unasyn for now.  Await speech/swallow evaluation.  4.  Hypothyroidism-continue Synthroid.  5.  GERD-continue Protonix.     All the records are reviewed and case discussed with Care Management/Social Worker. Management plans discussed with the patient, family and they are in agreement.  CODE STATUS: Full code  DVT Prophylaxis: Lovenox  TOTAL TIME TAKING CARE OF THIS PATIENT: 30 minutes.   POSSIBLE D/C IN 1-2 DAYS, DEPENDING ON CLINICAL CONDITION.   Henreitta Leber M.D on 09/08/2018 at 12:48 PM  Between 7am to 6pm - Pager - [phone]  After 6pm go to [email or web address] - Proofreader  [facility]  Office  [phone]  CC: Primary care physician; Patient, No Pcp Per

## 2018-09-08 NOTE — ED Notes (Signed)
Pt has returned from CT.  

## 2018-09-09 ENCOUNTER — Inpatient Hospital Stay: Payer: Medicare Other

## 2018-09-09 LAB — CBC
HCT: 34.3 % — ABNORMAL LOW (ref 39.0–52.0)
Hemoglobin: 11.3 g/dL — ABNORMAL LOW (ref 13.0–17.0)
MCH: 29.7 pg (ref 26.0–34.0)
MCHC: 32.9 g/dL (ref 30.0–36.0)
MCV: 90 fL (ref 80.0–100.0)
Platelets: 257 10*3/uL (ref 150–400)
RBC: 3.81 MIL/uL — ABNORMAL LOW (ref 4.22–5.81)
RDW: 13.6 % (ref 11.5–15.5)
WBC: 19.8 10*3/uL — ABNORMAL HIGH (ref 4.0–10.5)
nRBC: 0 % (ref 0.0–0.2)

## 2018-09-09 MED ORDER — AMOXICILLIN-POT CLAVULANATE 875-125 MG PO TABS: 1.0000 | ORAL_TABLET | Freq: Two times a day (BID) | ORAL | 0 refills | Status: AC

## 2018-09-09 MED ORDER — LEVOTHYROXINE SODIUM 75 MCG PO TABS
75.0000 ug | ORAL_TABLET | Freq: Every day | ORAL | Status: DC
  Administered 2018-09-09: 75 ug via ORAL
  Filled 2018-09-09: qty 1

## 2018-09-09 NOTE — Discharge Summary (Signed)
[facility] at [place] NAME: Mark Andrews    MR#:  [id number]  DATE OF BIRTH:  September 30, 1945  DATE OF ADMISSION:  09/07/2018 ADMITTING PHYSICIAN: Harrie Foreman, MD  DATE OF DISCHARGE: 09/09/2018  PRIMARY CARE PHYSICIAN: No primary care provider on file.    ADMISSION DIAGNOSIS:  Acute respiratory failure with hypoxia (HCC) [J96.01] Syncope, unspecified syncope type [R55] Aspiration pneumonia of lower lobe, unspecified aspiration pneumonia type, unspecified laterality ([place]) [J69.0]  DISCHARGE DIAGNOSIS:  Active Problems:   Sepsis ([place])   SECONDARY DIAGNOSIS:   Past Medical History:  Diagnosis Date  . GERD (gastroesophageal reflux disease)   . Hypothyroidism     HOSPITAL COURSE:   73 year old male with past medical history of hypothyroidism, GERD who is currently visiting from out of town presented to the hospital due to syncope.  1.  Syncope- etiology unclear.  Patient had very vague prodromal symptoms prior to his syncopal episode complaining of feeling hot and his legs hurting.  No acute cardiac or neurologic symptoms. -Suspected to be vasovagal syncope.  Appreciate cardiology input.  Echocardiogram showing normal ejection fraction with no acute structural abnormalities, patient's orthostatic vital signs are negative.  He was observed on telemetry and had no arrhythmias.  He is ambulating without any dizziness.  2.  Dysphagia-patient is apparently had intermittent dysphagia ongoing for the past 3 months he is currently being worked up in [place] for it.   -Obtained upper GI series which showed possible esophageal spasm but no acute stricture or obstruction.  Mild GERD.  Patient also seen by speech and started on a mechanical soft diet which she is tolerating.  Patient is going to be discharged with continue work-up for his ongoing dysphasia with his gastroenterologist in [place].  3.  Aspiration pneumonia-suspected based on his CT  scan respiratory symptoms. While in the hospital patient was treated with IV Unasyn and is now being discharged on oral Augmentin for additional 7 days.  He is tolerating a mechanical soft diet well after being evaluated by speech therapy.  4.  Hypothyroidism- he will continue Synthroid.  5.  GERD- he will continue Protonix.  DISCHARGE CONDITIONS:   Stable.   CONSULTS OBTAINED:  Treatment Team:  Teodoro Spray, MD  DRUG ALLERGIES:  No Known Allergies  DISCHARGE MEDICATIONS:   Allergies as of 09/09/2018   No Known Allergies     Medication List    TAKE these medications   amoxicillin-clavulanate 875-125 MG tablet Commonly known as:  AUGMENTIN Take 1 tablet by mouth 2 (two) times daily for 7 days.   levothyroxine 75 MCG tablet Commonly known as:  SYNTHROID, LEVOTHROID Take 75 mcg by mouth daily before breakfast.   pantoprazole 40 MG tablet Commonly known as:  PROTONIX Take 40 mg by mouth daily.   sodium fluoride 1.1 % Gel dental gel Commonly known as:  FLUORISHIELD Place 1 application onto teeth daily.         DISCHARGE INSTRUCTIONS:   DIET:  Soft diet  DISCHARGE CONDITION:  Stable  ACTIVITY:  Activity as tolerated  OXYGEN:  Home Oxygen: No.   Oxygen Delivery: room air  DISCHARGE LOCATION:  home   If you experience worsening of your admission symptoms, develop shortness of breath, life threatening emergency, suicidal or homicidal thoughts you must seek medical attention immediately by calling 911 or calling your MD immediately  if symptoms less severe.  You Must read complete instructions/literature along with all the possible adverse reactions/side effects  for all the Medicines you take and that have been prescribed to you. Take any new Medicines after you have completely understood and accpet all the possible adverse reactions/side effects.   Please note  You were cared for by a hospitalist during your hospital stay. If you have any questions  about your discharge medications or the care you received while you were in the hospital after you are discharged, you can call the unit and asked to speak with the hospitalist on call if the hospitalist that took care of you is not available. Once you are discharged, your primary care physician will handle any further medical issues. Please note that NO REFILLS for any discharge medications will be authorized once you are discharged, as it is imperative that you return to your primary care physician (or establish a relationship with a primary care physician if you do not have one) for your aftercare needs so that they can reassess your need for medications and monitor your lab values.     Today   No acute events overnight.  Seen by speech therapy and started on a soft diet which she is tolerating.  Upper GI series this morning showing esophageal spasm but no evidence of acute obstruction or stricture.  VITAL SIGNS:  Blood pressure 135/90, pulse 85, temperature 98.3 F (36.8 C), temperature source Oral, resp. rate 17, height 5\' 10"  (1.778 m), weight 75.5 kg, SpO2 96 %.  I/O:    Intake/Output Summary (Last 24 hours) at 09/09/2018 1425 Last data filed at 09/09/2018 0741 Gross per 24 hour  Intake 1528.34 ml  Output 1250 ml  Net 278.34 ml    PHYSICAL EXAMINATION:   GENERAL:  73 y.o.-year-old patient lying in bed in no acute distress.  EYES: Pupils equal, round, reactive to light and accommodation. No scleral icterus. Extraocular muscles intact.  HEENT: Head atraumatic, normocephalic. Oropharynx and nasopharynx clear.  NECK:  Supple, no jugular venous distention. No thyroid enlargement, no tenderness.  LUNGS: Normal breath sounds bilaterally, no wheezing, rales, some upper airway rhonchi b/l.  No use of accessory muscles of respiration.  CARDIOVASCULAR: S1, S2 normal. No murmurs, rubs, or gallops.  ABDOMEN: Soft, nontender, nondistended. Bowel sounds present. No organomegaly or mass.   EXTREMITIES: No cyanosis, clubbing or edema b/l.    NEUROLOGIC: Cranial nerves II through XII are intact. No focal Motor or sensory deficits b/l.   PSYCHIATRIC: The patient is alert and oriented x 3.  SKIN: No obvious rash, lesion, or ulcer.   DATA REVIEW:   CBC Recent Labs  Lab 09/09/18 0443  WBC 19.8*  HGB 11.3*  HCT 34.3*  PLT 257    Chemistries  Recent Labs  Lab 09/07/18 2258  NA 133*  K 4.0  CL 98  CO2 25  GLUCOSE 127*  BUN 20  CREATININE 1.23  CALCIUM 8.5*  AST 31  ALT 19  ALKPHOS 71  BILITOT 1.1    Cardiac Enzymes Recent Labs  Lab 09/07/18 2258  TROPONINI <0.03    Microbiology Results  No results found for this or any previous visit.  RADIOLOGY:  Ct Angio Chest Pe W And/or Wo Contrast  Result Date: 09/08/2018 CLINICAL DATA:  Syncopal episodes vomiting EXAM: CT ANGIOGRAPHY CHEST WITH CONTRAST TECHNIQUE: Multidetector CT imaging of the chest was performed using the standard protocol during bolus administration of intravenous contrast. Multiplanar CT image reconstructions and MIPs were obtained to evaluate the vascular anatomy. CONTRAST:  57mL OMNIPAQUE IOHEXOL 350 MG/ML SOLN COMPARISON:  Chest x-ray 09/07/2018  FINDINGS: Cardiovascular: Satisfactory opacification of the pulmonary arteries to the segmental level. No evidence of pulmonary embolism. Mild aortic atherosclerosis. No aneurysmal dilatation. Normal heart size. No significant pericardial effusion Mediastinum/Nodes: Midline trachea. No thyroid mass. Multiple enlarged mediastinal lymph nodes. 19 mm right paratracheal lymph node. Subcarinal lymph node measuring 2.5 cm. Right hilar nodes measuring up to 2 cm. Esophagus within normal limits. Lungs/Pleura: Extensive consolidation within the left lower lobe. Partial consolidations and nodularity within the right lower lobe and right middle lobe. 4 mm lingular nodule. No pleural effusion or pneumothorax. Upper Abdomen: No acute abnormality. Musculoskeletal: Age  indeterminate mild compression deformity at T3 and mild to moderate compression deformity at T6. Review of the MIP images confirms the above findings. IMPRESSION: 1. Negative for acute pulmonary embolus. 2. Extensive bilateral lower lobe and right middle lobe consolidations with multiple nodules and nodular foci of airspace disease. Findings suspicious for multifocal pneumonia and infectious/inflammatory nodules. Imaging follow-up to resolution is advised to exclude underlying mass lesions. 3. Moderate-to-marked mediastinal and right hilar adenopathy which may be reactive or secondary to metastatic disease or lymphoproliferative disease. Aortic Atherosclerosis (ICD10-I70.0). Electronically Signed   By: Donavan Foil M.D.   On: 09/08/2018 02:11   Dg Chest Portable 1 View  Result Date: 09/07/2018 CLINICAL DATA:  Two syncopal episodes today. Vomiting, shortness of breath, and cough for several days. EXAM: PORTABLE CHEST 1 VIEW COMPARISON:  None. FINDINGS: Heart size and pulmonary vascularity are normal. Emphysematous changes in the upper lungs. Infiltration in the lung bases could represent atelectasis, pneumonia, or aspiration. No blunting of costophrenic angles. No pneumothorax. Mediastinal contours appear intact. IMPRESSION: Emphysematous changes in the lungs. Infiltration or atelectasis in the lung bases. Electronically Signed   By: Lucienne Capers M.D.   On: 09/07/2018 23:54   Dg Duanne Limerick W/small Bowel High Density  Result Date: 09/09/2018 CLINICAL DATA:  Vomiting after eating.  Dysphagia. EXAM: UPPER GI SERIES (DOUBLE-CONTRAST) WITH SMALL BOWEL FOLLOW-THROUGH FLUOROSCOPY TIME:  Fluoroscopy Time:  1.2 minute Radiation Exposure Index (if provided by the fluoroscopic device): 36.9 mGy Number of Acquired Spot Images: 3 TECHNIQUE: Combined double contrast and single contrast upper GI series using effervescent crystals, thick barium, and thin barium. Subsequently, serial images of the small bowel were obtained  including spot views of the terminal ileum. COMPARISON:  None. FINDINGS: KUB: There is no bowel dilatation to suggest obstruction. There is no evidence of pneumoperitoneum, portal venous gas or pneumatosis. There are no pathologic calcifications along the expected course of the ureters. The osseous structures are unremarkable. DOUBLE-CONTRAST UPPER GI WITH SMALL-BOWEL FOLLOW-THROUGH: Examination of the esophagus demonstrated tertiary contractions intermittently of the distal half of the esophagus as can be seen with spasm. Incidental note was made of intermittent tracheal aspiration with cough reflex. Normal esophageal morphology without evidence of esophagitis or ulceration. No esophageal stricture, diverticula, or mass lesion. No evidence of hiatal hernia. Moderate gastroesophageal reflux. Examination of the stomach demonstrated normal rugal folds and areae gastricae. The gastric mucosa appeared unremarkable without evidence of ulceration, scarring, or mass lesion. Gastric motility and emptying was normal. Fluoroscopic examination of the duodenum demonstrates normal motility and morphology without evidence of ulceration or mass lesion. Medium density barium was periodically observed under fluoroscopy to travel from the stomach to the ascending colon (over a 30 minute time period). There is no evidence of small bowel stricture or obstruction. No large filling defects to suggest mass lesion. In addition, there is no evidence of tethering or definite inflammatory changes present within the  small bowel. IMPRESSION: 1. Tertiary contractions intermittently of the distal half of the esophagus as can be seen with spasm. 2. Incidental note was made of intermittent tracheal aspiration with cough reflex. Further evaluation with speech pathology may be helpful. 3. Moderate gastroesophageal reflux. 4. Normal upper GI and small-bowel follow-through. Normal gastric emptying. Electronically Signed   By: Kathreen Devoid   On:  09/09/2018 09:34      Management plans discussed with the patient, family and they are in agreement.  CODE STATUS:     Code Status Orders  (From admission, onward)         Start     Ordered   09/08/18 0405  Full code  Continuous     09/08/18 0404         TOTAL TIME TAKING CARE OF THIS PATIENT: 40 minutes.    Henreitta Leber M.D on 09/09/2018 at 2:25 PM  Between 7am to 6pm - Pager - [phone]  After 6pm go to [email or web address] - [employer] [facility]  Office  [phone]  CC: Primary care physician; No primary care provider on file.

## 2018-09-09 NOTE — Progress Notes (Signed)
Mark Andrews  A and O x 4. VSS. Pt tolerating diet well. No complaints of pain or nausea. IV removed intact, prescriptions given. Pt voiced understanding of discharge instructions with no further questions. Pt discharged via wheelchair with axillary.    Allergies as of 09/09/2018   No Known Allergies     Medication List    TAKE these medications   amoxicillin-clavulanate 875-125 MG tablet Commonly known as:  AUGMENTIN Take 1 tablet by mouth 2 (two) times daily for 7 days.   levothyroxine 75 MCG tablet Commonly known as:  SYNTHROID, LEVOTHROID Take 75 mcg by mouth daily before breakfast.   pantoprazole 40 MG tablet Commonly known as:  PROTONIX Take 40 mg by mouth daily.   sodium fluoride 1.1 % Gel dental gel Commonly known as:  FLUORISHIELD Place 1 application onto teeth daily.       Vitals:   09/09/18 1117 09/09/18 1300  BP:  135/90  Pulse:  85  Resp:    Temp:  98.3 F (36.8 C)  SpO2: 94% 96%    Francesco Sor

## 2018-09-09 NOTE — Evaluation (Signed)
Clinical/Bedside Swallow Evaluation Patient Details  Name: Mark Andrews MRN: [id number] Date of Birth: [birth year]  Today's Date: 09/09/2018 Time: SLP Start Time (ACUTE ONLY): 0930 SLP Stop Time (ACUTE ONLY): 1030 SLP Time Calculation (min) (ACUTE ONLY): 60 min  Past Medical History:  Past Medical History:  Diagnosis Date  . GERD (gastroesophageal reflux disease)   . Hypothyroidism    Past Surgical History:  Past Surgical History:  Procedure Laterality Date  . none     HPI:  Pt is a 73 y.o. 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  The patient states that around 7 PM this evening he started to have some leg pain.  He then became very lightheaded and syncopized.  Per his wife, he began vomiting during the syncope.  He had a second episode of syncope a short time later, and also further vomiting while in triage in the ED here.  Since he passed out, he has been short of breath - nonrebreather placed.  He also reports feeling weak with myalgias since this afternoon, and has had a nonproductive cough for several weeks.  Pt and wife report a baseline of Dysphagia resulting from the H&N Ca and XRT - "scar tissue" per Wife. Pt utilized strategies to aid his swallowing at home baseline. He does report difficulty solids requiring multiple sips of water to help clear when swallowing. He does avoid some foods such as "steak".  Per wife, pt drinks "3" Ensure daily. Pt was able to describe few foods he eats at home. Pt c/o xerostomia baseline.    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 Pt received XRT and chemotherapy per pt/wife. Pt has since had dysphagia and utilizes strategies to aid his swallowing to  include a d2nd swallow, more effortful swallowing, and sips of liquid following a bite of food to aid "washing it down". Pt and wife reported swallowing "may be a little more difficult over the past year". He does limit some foods from his diet; reports more difficulty w/ solids(meats). He does eat breads but moistens them.  Pt sat EOB feeding self trials of ice chips, thin liquids via cup, purees and soft solids. Oral phase appeared grossly [facility] w/ no gross deficits; adequate bolus management and timely A-P transfer w/ all trials and oral clearing achieved b/t bites/sips. During the pharyngeal phase, pt exhibited effortful swallowing using a f/u, 2nd swallow - there seemed to be a swallow initiation w/ laryngeal excursion, hesitation, then the full 2nd swallow to fully complete the swallow. Noted min decreased hyolaryngeal excursion. A delayed throat clear and/or cough occurred x4 - pt stated this is baseline for him since the XRT, Ca. When pt utilized precaution of smaller sips/bolus volume following the food bolus - "wetting his mouth w/ a wet spoon" vs taking a full sip, less to no overt s/s of aspiration were noted to follow w/ several ozs. Pt stated this f/u sip is to aid the clearing of solid foods "through my throat" but suspect it could be the UES/upper Esophagus area as pt was fully conversive w/ no pharyngeal-laryngeal residue noted to impact the vocal quality. No decline in respiratory status noted during/post trials. OM exam revealed no unilateral weakness; gag reflex+. Recommend  a dysphagia level 3 w/ thin liquids; general aspiration precautions; swallowing strategies to include small, single sips/bites; use of wet spoon to generate the f/u swallow (peristalsis) to aid clearing of food trials during meals. Recommend Pills Whole in puree for more cohesive swallowing and lessen risk for aspiration, choking. Recommend f/u w/ objective swallow study w/ PCP order to update information on his oropharyngeal  phase swallowing function upon returning home if he desires. 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.13)(baseline deficits d/t H&N Ca w/ XRT)    Aspiration Risk  Mild aspiration risk;Risk for inadequate nutrition/hydration - but reduced following precautions   Diet Recommendation  Dysphagia level 3 (mech soft foods moistened) w/ thin liquids; general aspiration precautions; swallowing strategies to include "wetting" the mouth to generate a swallow vs sips of liquid when clearing food boluses  Medication Administration: Whole meds with puree(for safer swallowing)    Other  Recommendations Recommended Consults: Consider GI evaluation;Consider esophageal assessment(ongoing; Dietician f/u for support/ed) Oral Care Recommendations: Oral care BID;Patient independent with oral care Other Recommendations: (n/a)   Follow up Recommendations None      Frequency and Duration (n/a)  (n/a)       Prognosis Prognosis for Safe Diet Advancement: Fair Barriers to Reach Goals: (baseline H&N Ca w/ XRT)      Swallow Study   General Date of Onset: 09/08/18 HPI: Pt is a 73 y.o. 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  The patient states that around 7 PM this evening he started to have some leg pain.  He then became very lightheaded and syncopized.  Per his wife, he began vomiting during the syncope.  He had a second episode of syncope a short time later, and also further vomiting while in triage in the ED here.  Since he passed out, he has been short of breath - nonrebreather placed.  He also reports feeling weak with myalgias since this afternoon, and has had a nonproductive  cough for several weeks.  Pt and wife report a baseline of Dysphagia resulting from the H&N Ca and XRT - "scar tissue" per Wife. Pt utilized strategies to aid his swallowing at home baseline. He does report difficulty solids requiring multiple sips of water to help clear when swallowing. He does avoid some foods such as "steak".  Per wife, pt drinks "3" Ensure daily. Pt was able to describe few foods he eats at home. Pt c/o xerostomia baseline.  Type of Study: Bedside Swallow Evaluation Previous Swallow Assessment: ~8-10 years ago w/ no aspiration noted then but increased risk for such per pt report Diet Prior to this Study: Regular;Thin liquids Temperature Spikes Noted: No(wbc 19.8 at admission) Respiratory Status: Nasal cannula(1 liters) History of Recent Intubation: No Behavior/Cognition: Alert;Cooperative;Pleasant mood(wife present) Oral Cavity Assessment: Within Functional Limits Oral Care Completed by SLP: Recent completion by staff(min white coating d/t contrast earlier) Oral Cavity - Dentition: Adequate natural dentition Vision: Functional for self-feeding Self-Feeding Abilities: Able to feed self Patient Positioning: Upright in bed(sitting EOB) Baseline Vocal Quality: Normal Volitional Cough: Strong Volitional Swallow: Able to elicit    Oral/Motor/Sensory Function Overall Oral Motor/Sensory Function: Within functional limits(grossly - pt states movements are "min effortful")   Ice Chips Ice  chips: Impaired Presentation: Spoon(fed; 3 trials) Oral Phase Impairments: (n/a) Oral Phase Functional Implications: (n/a) Pharyngeal Phase Impairments: Decreased hyoid-laryngeal movement;Multiple swallows(min effortful swallowing) Other Comments: pt states this is baseline for him since the XRT, Ca   Thin Liquid Thin Liquid: Impaired Presentation: Cup;Self Fed(8 ozs total) Oral Phase Impairments: (n/a) Oral Phase Functional Implications: (n/a) Pharyngeal  Phase Impairments: Decreased  hyoid-laryngeal movement;Multiple swallows;Cough - Delayed;Throat Clearing - Delayed(min effortful swallow; overt s/s 4x) Other Comments: pt states this is baseline for him since the XRT, Ca. Noted that when pt utilized strategy of "wetting his mouth" vs taking larger sips, the throat clearing/cough was lessened significantly.     Nectar Thick Nectar Thick Liquid: Not tested   Honey Thick Honey Thick Liquid: Not tested   Puree Puree: Impaired Presentation: Self Fed;Spoon(8 trials) Oral Phase Impairments: (n/a) Oral Phase Functional Implications: (n/a) Pharyngeal Phase Impairments: Decreased hyoid-laryngeal movement;Multiple swallows;Throat Clearing - Delayed(min effortful swallowing; x2) Other Comments: pt states this is baseline for him since the XRT, Ca   Solid     Solid: Impaired Presentation: Spoon;Self Fed(4 trials; mech soft trials) Oral Phase Impairments: (n/a) Oral Phase Functional Implications: (n/a) Pharyngeal Phase Impairments: Decreased hyoid-laryngeal movement;Multiple swallows(min effortful swallowing) Other Comments: pt states this is baseline for him since the XRT, Ca      Orinda Kenner, MS, CCC-SLP Shreshta Medley 09/09/2018,1:54 PM

## 2018-09-09 NOTE — Care Management (Signed)
Per bedside RN Patient is ambulating independently and maintaining sats on RA with exertion

## 2019-07-11 ENCOUNTER — Other Ambulatory Visit: Payer: Self-pay

## 2019-07-11 DIAGNOSIS — Z20822 Contact with and (suspected) exposure to covid-19: Secondary | ICD-10-CM

## 2019-07-14 LAB — NOVEL CORONAVIRUS, NAA: SARS-CoV-2, NAA: NOT DETECTED

## 2019-08-05 ENCOUNTER — Other Ambulatory Visit: Payer: Self-pay

## 2019-08-05 ENCOUNTER — Emergency Department: Payer: Medicare Other

## 2019-08-05 ENCOUNTER — Inpatient Hospital Stay
Admission: EM | Admit: 2019-08-05 | Discharge: 2019-08-12 | DRG: 871 | Disposition: A | Discharge status: Home / Self Care | Payer: Medicare Other | Attending: Internal Medicine | Admitting: Internal Medicine

## 2019-08-05 DIAGNOSIS — R0902 Hypoxemia: Secondary | ICD-10-CM

## 2019-08-05 DIAGNOSIS — Z8249 Family history of ischemic heart disease and other diseases of the circulatory system: Secondary | ICD-10-CM

## 2019-08-05 DIAGNOSIS — Z8673 Personal history of transient ischemic attack (TIA), and cerebral infarction without residual deficits: Secondary | ICD-10-CM | POA: Diagnosis not present

## 2019-08-05 DIAGNOSIS — E878 Other disorders of electrolyte and fluid balance, not elsewhere classified: Secondary | ICD-10-CM | POA: Diagnosis present

## 2019-08-05 DIAGNOSIS — B371 Pulmonary candidiasis: Secondary | ICD-10-CM | POA: Diagnosis present

## 2019-08-05 DIAGNOSIS — Z9089 Acquired absence of other organs: Secondary | ICD-10-CM

## 2019-08-05 DIAGNOSIS — R590 Localized enlarged lymph nodes: Secondary | ICD-10-CM

## 2019-08-05 DIAGNOSIS — E871 Hypo-osmolality and hyponatremia: Secondary | ICD-10-CM

## 2019-08-05 DIAGNOSIS — Z79899 Other long term (current) drug therapy: Secondary | ICD-10-CM

## 2019-08-05 DIAGNOSIS — E039 Hypothyroidism, unspecified: Secondary | ICD-10-CM

## 2019-08-05 DIAGNOSIS — R0602 Shortness of breath: Secondary | ICD-10-CM

## 2019-08-05 DIAGNOSIS — Z20828 Contact with and (suspected) exposure to other viral communicable diseases: Secondary | ICD-10-CM | POA: Diagnosis present

## 2019-08-05 DIAGNOSIS — Z8619 Personal history of other infectious and parasitic diseases: Secondary | ICD-10-CM | POA: Diagnosis not present

## 2019-08-05 DIAGNOSIS — J9601 Acute respiratory failure with hypoxia: Secondary | ICD-10-CM | POA: Diagnosis present

## 2019-08-05 DIAGNOSIS — Z85819 Personal history of malignant neoplasm of unspecified site of lip, oral cavity, and pharynx: Secondary | ICD-10-CM

## 2019-08-05 DIAGNOSIS — Z6821 Body mass index (BMI) 21.0-21.9, adult: Secondary | ICD-10-CM | POA: Diagnosis not present

## 2019-08-05 DIAGNOSIS — R1310 Dysphagia, unspecified: Secondary | ICD-10-CM | POA: Diagnosis present

## 2019-08-05 DIAGNOSIS — Z79890 Hormone replacement therapy (postmenopausal): Secondary | ICD-10-CM | POA: Diagnosis not present

## 2019-08-05 DIAGNOSIS — J690 Pneumonitis due to inhalation of food and vomit: Secondary | ICD-10-CM | POA: Diagnosis present

## 2019-08-05 DIAGNOSIS — E43 Unspecified severe protein-calorie malnutrition: Secondary | ICD-10-CM | POA: Insufficient documentation

## 2019-08-05 DIAGNOSIS — R112 Nausea with vomiting, unspecified: Secondary | ICD-10-CM | POA: Diagnosis present

## 2019-08-05 DIAGNOSIS — J189 Pneumonia, unspecified organism: Secondary | ICD-10-CM

## 2019-08-05 DIAGNOSIS — Z931 Gastrostomy status: Secondary | ICD-10-CM | POA: Diagnosis not present

## 2019-08-05 DIAGNOSIS — A419 Sepsis, unspecified organism: Secondary | ICD-10-CM | POA: Diagnosis present

## 2019-08-05 DIAGNOSIS — K219 Gastro-esophageal reflux disease without esophagitis: Secondary | ICD-10-CM

## 2019-08-05 HISTORY — DX: Unspecified foreign body in respiratory tract, part unspecified causing other injury, initial encounter: T17.908A

## 2019-08-05 HISTORY — DX: Malignant (primary) neoplasm, unspecified: C80.1

## 2019-08-05 LAB — TROPONIN I (HIGH SENSITIVITY)
Troponin I (High Sensitivity): 6 ng/L (ref <18)
Troponin I (High Sensitivity): 6 ng/L (ref <18)

## 2019-08-05 LAB — URINALYSIS, ROUTINE W REFLEX MICROSCOPIC
Bacteria, UA: NONE SEEN
Bilirubin Urine: NEGATIVE
Glucose, UA: NEGATIVE mg/dL
Hgb urine dipstick: NEGATIVE
Ketones, ur: NEGATIVE mg/dL
Leukocytes,Ua: NEGATIVE
Nitrite: NEGATIVE
Protein, ur: 30 mg/dL — AB
Specific Gravity, Urine: 1.021 (ref 1.005–1.030)
Squamous Epithelial / HPF: NONE SEEN (ref 0–5)
pH: 7 (ref 5.0–8.0)

## 2019-08-05 LAB — STREP PNEUMONIAE URINARY ANTIGEN: Strep Pneumo Urinary Antigen: NEGATIVE

## 2019-08-05 LAB — BASIC METABOLIC PANEL
Anion gap: 12 (ref 5–15)
BUN: 18 mg/dL (ref 8–23)
CO2: 28 mmol/L (ref 22–32)
Calcium: 8.8 mg/dL — ABNORMAL LOW (ref 8.9–10.3)
Chloride: 88 mmol/L — ABNORMAL LOW (ref 98–111)
Creatinine, Ser: 0.94 mg/dL (ref 0.61–1.24)
GFR calc Af Amer: >60 mL/min (ref >60)
GFR calc non Af Amer: >60 mL/min (ref >60)
Glucose, Bld: 108 mg/dL — ABNORMAL HIGH (ref 70–99)
Potassium: 4.6 mmol/L (ref 3.5–5.1)
Sodium: 128 mmol/L — ABNORMAL LOW (ref 135–145)

## 2019-08-05 LAB — CBC WITH DIFFERENTIAL/PLATELET
Abs Immature Granulocytes: 0.08 10*3/uL — ABNORMAL HIGH (ref 0.00–0.07)
Basophils Absolute: 0 10*3/uL (ref 0.0–0.1)
Basophils Relative: 0 %
Eosinophils Absolute: 0 10*3/uL (ref 0.0–0.5)
Eosinophils Relative: 0 %
HCT: 41.3 % (ref 39.0–52.0)
Hemoglobin: 13.8 g/dL (ref 13.0–17.0)
Immature Granulocytes: 1 %
Lymphocytes Relative: 10 %
Lymphs Abs: 1.2 10*3/uL (ref 0.7–4.0)
MCH: 30.1 pg (ref 26.0–34.0)
MCHC: 33.4 g/dL (ref 30.0–36.0)
MCV: 90.2 fL (ref 80.0–100.0)
Monocytes Absolute: 1.5 10*3/uL — ABNORMAL HIGH (ref 0.1–1.0)
Monocytes Relative: 13 %
Neutro Abs: 9.1 10*3/uL — ABNORMAL HIGH (ref 1.7–7.7)
Neutrophils Relative %: 76 %
Platelets: 361 10*3/uL (ref 150–400)
RBC: 4.58 MIL/uL (ref 4.22–5.81)
RDW: 12.6 % (ref 11.5–15.5)
WBC: 11.9 10*3/uL — ABNORMAL HIGH (ref 4.0–10.5)
nRBC: 0 % (ref 0.0–0.2)

## 2019-08-05 LAB — PROTIME-INR
INR: 1 (ref 0.8–1.2)
Prothrombin Time: 13.2 seconds (ref 11.4–15.2)

## 2019-08-05 LAB — EXPECTORATED SPUTUM ASSESSMENT W GRAM STAIN, RFLX TO RESP C

## 2019-08-05 LAB — LACTIC ACID, PLASMA
Lactic Acid, Venous: 1.1 mmol/L (ref 0.5–1.9)
Lactic Acid, Venous: 1.5 mmol/L (ref 0.5–1.9)

## 2019-08-05 LAB — PROCALCITONIN: Procalcitonin: 0.24 ng/mL

## 2019-08-05 LAB — HIV ANTIBODY (ROUTINE TESTING W REFLEX): HIV Screen 4th Generation wRfx: NONREACTIVE

## 2019-08-05 LAB — POC SARS CORONAVIRUS 2 AG: SARS Coronavirus 2 Ag: NEGATIVE

## 2019-08-05 LAB — APTT: aPTT: 35 seconds (ref 24–36)

## 2019-08-05 MED ORDER — GUAIFENESIN 100 MG/5ML PO SOLN: 5.0000 mL | ORAL | Status: DC | PRN

## 2019-08-05 MED ORDER — IPRATROPIUM-ALBUTEROL 0.5-2.5 (3) MG/3ML IN SOLN
3.0000 mL | RESPIRATORY_TRACT | Status: DC | PRN
  Administered 2019-08-07: 3 mL via RESPIRATORY_TRACT
  Filled 2019-08-05 (×2): qty 3

## 2019-08-05 MED ORDER — ONDANSETRON HCL 4 MG PO TABS
4.0000 mg | ORAL_TABLET | Freq: Four times a day (QID) | ORAL | Status: DC | PRN
  Filled 2019-08-05: qty 1

## 2019-08-05 MED ORDER — PANTOPRAZOLE SODIUM 40 MG PO TBEC
40.0000 mg | DELAYED_RELEASE_TABLET | Freq: Every day | ORAL | Status: DC
  Administered 2019-08-06: 40 mg via ORAL
  Filled 2019-08-05: qty 1

## 2019-08-05 MED ORDER — TRAZODONE HCL 50 MG PO TABS
25.0000 mg | ORAL_TABLET | Freq: Every evening | ORAL | Status: DC | PRN
  Filled 2019-08-05: qty 0.5

## 2019-08-05 MED ORDER — ONDANSETRON HCL 4 MG/2ML IJ SOLN
4.0000 mg | Freq: Four times a day (QID) | INTRAMUSCULAR | Status: DC | PRN
  Administered 2019-08-06: 22:00:00 4 mg via INTRAVENOUS
  Filled 2019-08-05: qty 2

## 2019-08-05 MED ORDER — SODIUM FLUORIDE 1.1 % DT GEL
1.0000 "application " | Freq: Every day | DENTAL | Status: DC
  Filled 2019-08-05 (×2): qty 51

## 2019-08-05 MED ORDER — LACTATED RINGERS IV BOLUS
1000.0000 mL | Freq: Once | INTRAVENOUS | Status: AC
  Administered 2019-08-05: 1000 mL via INTRAVENOUS

## 2019-08-05 MED ORDER — LEVOTHYROXINE SODIUM 75 MCG PO TABS
75.0000 ug | ORAL_TABLET | Freq: Every day | ORAL | Status: DC
  Filled 2019-08-05: qty 1

## 2019-08-05 MED ORDER — ACETAMINOPHEN 650 MG RE SUPP
650.0000 mg | Freq: Four times a day (QID) | RECTAL | Status: DC | PRN
  Administered 2019-08-07: 03:00:00 650 mg via RECTAL

## 2019-08-05 MED ORDER — SODIUM CHLORIDE 0.9 % IV SOLN
INTRAVENOUS | Status: DC
  Administered 2019-08-06 – 2019-08-09 (×6): via INTRAVENOUS

## 2019-08-05 MED ORDER — SODIUM CHLORIDE 0.9 % IV SOLN
3.0000 g | Freq: Once | INTRAVENOUS | Status: AC
  Administered 2019-08-05: 3 g via INTRAVENOUS
  Filled 2019-08-05: qty 8

## 2019-08-05 MED ORDER — GUAIFENESIN 100 MG/5ML PO SOLN
5.0000 mL | ORAL | Status: DC | PRN
  Filled 2019-08-05: qty 5

## 2019-08-05 MED ORDER — MAGNESIUM HYDROXIDE 400 MG/5ML PO SUSP: 30.0000 mL | Freq: Every day | ORAL | Status: DC | PRN

## 2019-08-05 MED ORDER — SODIUM CHLORIDE 0.9 % IV SOLN
3.0000 g | Freq: Four times a day (QID) | INTRAVENOUS | Status: DC
  Administered 2019-08-06 – 2019-08-09 (×15): 3 g via INTRAVENOUS
  Filled 2019-08-05 (×4): qty 3
  Filled 2019-08-05 (×5): qty 8
  Filled 2019-08-05 (×2): qty 3
  Filled 2019-08-05 (×4): qty 8
  Filled 2019-08-05 (×3): qty 3
  Filled 2019-08-05: qty 8
  Filled 2019-08-05: qty 3

## 2019-08-05 MED ORDER — SODIUM CHLORIDE 0.9 % IV SOLN
500.0000 mg | INTRAVENOUS | Status: AC
  Administered 2019-08-06 – 2019-08-09 (×4): 500 mg via INTRAVENOUS
  Filled 2019-08-05 (×4): qty 500

## 2019-08-05 MED ORDER — ACETAMINOPHEN 325 MG PO TABS: 650.0000 mg | ORAL_TABLET | Freq: Four times a day (QID) | ORAL | Status: DC | PRN

## 2019-08-05 MED ORDER — ENOXAPARIN SODIUM 40 MG/0.4ML ~~LOC~~ SOLN
40.0000 mg | SUBCUTANEOUS | Status: DC
  Administered 2019-08-07 – 2019-08-10 (×4): 40 mg via SUBCUTANEOUS
  Filled 2019-08-05 (×6): qty 0.4

## 2019-08-05 NOTE — ED Triage Notes (Signed)
Pt c/o SOB with N/V for the past week. Denies any  Pain. Pt states he has a hx of aspiration PNE since having throat cancer.Marland Kitchen

## 2019-08-05 NOTE — H&P (Signed)
[facility] [place] NAME: Mark Andrews    MR#:  [id number]  DATE OF BIRTH:  [birth year]  DATE OF ADMISSION:  08/05/2019  PRIMARY CARE PHYSICIAN: System, Pcp Not In   REQUESTING/REFERRING PHYSICIAN: Blake Divine, MD CHIEF COMPLAINT:   Chief Complaint  Patient presents with   Shortness of Breath   Emesis    HISTORY OF PRESENT ILLNESS:  Mark Andrews  is a 73 y.o. pleasant Caucasian male with a known history of hypothyroidism and GERD status post G-tube for previous history of aspiration, presented to the emergency room with the onset of intractable nausea and vomiting since this morning followed by worsening dyspnea and cough without expectoration.  The patient has been having irritant cough over the last several months.  He denied any bilious vomitus or hematemesis.  No diarrhea or abdominal pain.  He denied any fever or chills.  He had his PEG tube placed about 6 months ago.  He denies any dysuria, oliguria or hematuria or flank pain.  Upon presentation to the emergency room, blood pressure was 145/110 and respiratory rate was 20 and later 35 and pulse oximetry as been as low as 88% on room air and up to 95-97% on 3 L of O2 by nasal cannula.  Labs revealed hyponatremia 128 and hypochloremia of 88 with a high-sensitivity troponin of 6.  Lactic acid was 1.1 and procalcitonin 0.24.  CBC showed leukocytosis of 11.9 with neutrophilia.  Blood cultures were drawn.  SARS antigen came back negative.  EKG showed no sinus rhythm with rate of 98.  Two-view chest x-ray showed small right pleural effusion and stable bibasal scarring and faint right upper lobe opacity that may represent pneumonia with recommendation for follow-up to ensure resolution..  The patient was given IV Unasyn and hydration with IV lactated Ringer.  He will be admitted to a medical monitored bed for further evaluation and management. PAST MEDICAL HISTORY:   Past Medical History:  Diagnosis Date    Cancer ([place])    GERD (gastroesophageal reflux disease)    Hypothyroidism     PAST SURGICAL HISTORY:   Past Surgical History:  Procedure Laterality Date   GASTROSTOMY TUBE PLACEMENT     none     TONSILLECTOMY     with CANCER    SOCIAL HISTORY:   Social History   Tobacco Use   Smoking status: Never Smoker   Smokeless tobacco: Never Used  Substance Use Topics   Alcohol use: Not Currently    FAMILY HISTORY:   Family History  Problem Relation Age of Onset   Pulmonary embolism Mother        and DVT    DRUG ALLERGIES:  No Known Allergies  REVIEW OF SYSTEMS:   ROS As per history of present illness. All pertinent systems were reviewed above. Constitutional,  HEENT, cardiovascular, respiratory, GI, GU, musculoskeletal, neuro, psychiatric, endocrine,  integumentary and hematologic systems were reviewed and are otherwise  negative/unremarkable except for positive findings mentioned above in the HPI.   MEDICATIONS AT HOME:   Prior to Admission medications   Medication Sig Start Date End Date Taking? Authorizing Provider  levothyroxine (SYNTHROID, LEVOTHROID) 75 MCG tablet Take 75 mcg by mouth daily before breakfast.    [provider]  pantoprazole (PROTONIX) 40 MG tablet Take 40 mg by mouth daily.    [provider]  sodium fluoride (FLUORISHIELD) 1.1 % GEL dental gel Place 1 application onto teeth daily.    [provider]      VITAL SIGNS:  Blood pressure (!) 152/97, pulse 98, temperature 99 F (37.2 C), temperature source Oral, resp. rate (!) 21, height 5\' 11"  (1.803 m), weight 68.9 kg, SpO2 94 %.  PHYSICAL EXAMINATION:  Physical Exam  GENERAL:  73 y.o.-year-old pleasant Caucasian male patient lying in the bed in mild respiratory distress with conversational dyspnea. EYES: Pupils equal, round, reactive to light and accommodation. No scleral icterus. Extraocular muscles intact.  HEENT: Head atraumatic, normocephalic.  Oropharynx and nasopharynx clear.  NECK:  Supple, no jugular venous distention. No thyroid enlargement, no tenderness.  LUNGS: He had diminished bibasal breath sounds as well as right upper lobe with right upper lobe mild crackles.  No wheezing, rales or rhonchi.  No use of accessory muscles of respiration.  CARDIOVASCULAR: Regular rate and rhythm, S1, S2 normal. No murmurs, rubs, or gallops.  ABDOMEN: Soft, nondistended, nontender. Bowel sounds present. No organomegaly or mass.  G-tube was in place. EXTREMITIES: No pedal edema, cyanosis, or clubbing.  NEUROLOGIC: Cranial nerves II through XII are intact. Muscle strength 5/5 in all extremities. Sensation intact. Gait not checked.  PSYCHIATRIC: The patient is alert and oriented x 3.  Normal affect and good eye contact. SKIN: No obvious rash, lesion, or ulcer.   LABORATORY PANEL:   CBC Recent Labs  Lab 08/05/19 1437  WBC 11.9*  HGB 13.8  HCT 41.3  PLT 361   ------------------------------------------------------------------------------------------------------------------  Chemistries  Recent Labs  Lab 08/05/19 1437  NA 128*  K 4.6  CL 88*  CO2 28  GLUCOSE 108*  BUN 18  CREATININE 0.94  CALCIUM 8.8*   ------------------------------------------------------------------------------------------------------------------  Cardiac Enzymes No results for input(s): TROPONINI in the last 168 hours. ------------------------------------------------------------------------------------------------------------------  RADIOLOGY:  Dg Chest 2 View  Result Date: 08/05/2019 CLINICAL DATA:  Shortness of breath. EXAM: CHEST - 2 VIEW COMPARISON:  September 07, 2018. FINDINGS: The heart size and mediastinal contours are within normal limits. No pneumothorax is noted. Small right pleural effusion is noted. Stable bibasilar scarring is noted. Faint right upper lobe opacity is noted which may represent pneumonia. The visualized skeletal structures are  unremarkable. IMPRESSION: Small right pleural effusion is noted. Stable bibasilar scarring is noted. Faint right upper lobe opacity is noted which may represent pneumonia. Continued radiographic follow-up is recommended to ensure resolution. Electronically Signed   By: Marijo Conception M.D.   On: 08/05/2019 15:05      IMPRESSION AND PLAN:   1.  Aspiration pneumonia.  The patient will be admitted to a medical monitored bed.  He will be placed on IV Unasyn and Zithromax.  Will follow blood and sputum culture.  Will check urine Legionella and strep pneumoniae antigens.  He is high risk for aspiration due to G-tube feedings and reflux.  2.  Mild sepsis without severe sepsis or septic shock..  This is manifested by tachypnea that was up to 34 and leukocytosis.  Management as above.  3.  Acute hypoxemic respiratory failure secondary to #1.  O2 protocol will be followed.  4.  Hypothyroidism.  We will obtain TSH level and continue Synthroid.  5.  History of CVA, status post G-tube.  We will hold G-tube feedings tonight  6.  GERD.  We will continue PPI therapy.  7.  DVT prophylaxis.  Subcutaneous Lovenox   All the records are reviewed and case discussed with ED provider. The plan of care was discussed in details with the patient (and family). I answered all questions. The patient  agreed to proceed with the above mentioned plan. Further management will depend upon hospital course.   CODE STATUS: Full code  TOTAL TIME TAKING CARE OF THIS PATIENT: 55 minutes.    Christel Mormon M.D on 08/05/2019 at 8:20 PM  [facility]   From 7 PM-7 AM, contact night-coverage [email or web address]  CC: Primary care physician; System, Pcp Not In   Note: This dictation was prepared with Dragon dictation along with smaller phrase technology. Any transcriptional errors that result from this process are unintentional.

## 2019-08-05 NOTE — ED Notes (Signed)
Patient transported to X-ray 

## 2019-08-05 NOTE — ED Provider Notes (Signed)
[facility] Emergency Department Provider Note   ____________________________________________   First MD Initiated Contact with Patient 08/05/19 1751     (approximate)  I have reviewed the triage vital signs and the nursing notes.   HISTORY  Chief Complaint Shortness of Breath and Emesis    HPI Mark Andrews is a 73 y.o. male with past medical history of throat cancer, recurrent aspiration status post PEG tube, and GERD who presents to the ED complaining of shortness of breath.  Patient reports he has had increasing shortness of breath over about the past week.  He states that he has been having to stop and rest after walking up the stairs, which is abnormal for him.  He checked his O2 saturations at home earlier today and noted to them to be in the low 80s.  He has been coughing over the past couple of days, which is productive of dark brown sputum, and he reports multiple episodes of vomiting over the past few days.  He states he deals with recurrent aspiration after being diagnosed with throat cancer, only takes pills and small amounts of water by mouth.  Whenever he develops nausea and vomiting, he reports dealing with aspiration pneumonia.  He complains of some mild pain over his right lower chest, worse when he takes a deep breath.        Past Medical History:  Diagnosis Date  . Cancer ([place])   . GERD (gastroesophageal reflux disease)   . Hypothyroidism     Patient Active Problem List   Diagnosis Date Noted  . Sepsis ([place]) 09/08/2018    Past Surgical History:  Procedure Laterality Date  . GASTROSTOMY TUBE PLACEMENT    . none    . TONSILLECTOMY     with CANCER    Prior to Admission medications   Medication Sig Start Date End Date Taking? Authorizing Provider  levothyroxine (SYNTHROID, LEVOTHROID) 75 MCG tablet Take 75 mcg by mouth daily before breakfast.    [provider]  pantoprazole (PROTONIX) 40 MG tablet Take 40 mg by mouth  daily.    [provider]  sodium fluoride (FLUORISHIELD) 1.1 % GEL dental gel Place 1 application onto teeth daily.    [provider]    Allergies Patient has no known allergies.  Family History  Problem Relation Age of Onset  . Pulmonary embolism Mother        and DVT    Social History Social History   Tobacco Use  . Smoking status: Never Smoker  . Smokeless tobacco: Never Used  Substance Use Topics  . Alcohol use: Not Currently  . Drug use: Not Currently    Review of Systems  Constitutional: No fever/chills Eyes: No visual changes. ENT: No sore throat. Cardiovascular: Positive for chest pain. Respiratory: Positive for cough and shortness of breath. Gastrointestinal: No abdominal pain.  Positive for nausea and vomiting.  No diarrhea.  No constipation. Genitourinary: Negative for dysuria. Musculoskeletal: Negative for back pain. Skin: Negative for rash. Neurological: Negative for headaches, focal weakness or numbness.  ____________________________________________   PHYSICAL EXAM:  VITAL SIGNS: ED Triage Vitals  Enc Vitals Group     BP 08/05/19 1430 (!) 110/95     Pulse Rate 08/05/19 1430 (!) 110     Resp 08/05/19 1430 20     Temp 08/05/19 1430 99 F (37.2 C)     Temp Source 08/05/19 1430 Oral     SpO2 08/05/19 1430 (!) 88 %  Weight 08/05/19 1431 152 lb (68.9 kg)     Height 08/05/19 1431 5\' 11"  (1.803 m)     Head Circumference --      Peak Flow --      Pain Score 08/05/19 1431 4     Pain Loc --      Pain Edu? --      Excl. in [place]? --     Constitutional: Alert and oriented. Eyes: Conjunctivae are normal. Head: Atraumatic. Nose: No congestion/rhinnorhea. Mouth/Throat: Mucous membranes are moist. Neck: Normal ROM Cardiovascular: Normal rate, regular rhythm. Grossly normal heart sounds. Respiratory: Tachypneic without respiratory distress or increased work of breathing.  No retractions. Lungs CTAB.  No chest wall tenderness.  Gastrointestinal: Soft and nontender. No distention. Genitourinary: deferred Musculoskeletal: No lower extremity tenderness nor edema. Neurologic:  Normal speech and language. No gross focal neurologic deficits are appreciated. Skin:  Skin is warm, dry and intact. No rash noted. Psychiatric: Mood and affect are normal. Speech and behavior are normal.  ____________________________________________   LABS (all labs ordered are listed, but only abnormal results are displayed)  Labs Reviewed  BASIC METABOLIC PANEL - Abnormal; Notable for the following components:      Result Value   Sodium 128 (*)    Chloride 88 (*)    Glucose, Bld 108 (*)    Calcium 8.8 (*)    All other components within normal limits  CBC WITH DIFFERENTIAL/PLATELET - Abnormal; Notable for the following components:   WBC 11.9 (*)    Neutro Abs 9.1 (*)    Monocytes Absolute 1.5 (*)    Abs Immature Granulocytes 0.08 (*)    All other components within normal limits  CULTURE, BLOOD (ROUTINE X 2)  CULTURE, BLOOD (ROUTINE X 2)  URINE CULTURE  SARS CORONAVIRUS 2 (TAT 6-24 HRS)  LACTIC ACID, PLASMA  APTT  PROTIME-INR  PROCALCITONIN  LACTIC ACID, PLASMA  URINALYSIS, ROUTINE W REFLEX MICROSCOPIC  POC SARS CORONAVIRUS 2 AG  POC SARS CORONAVIRUS 2 AG -  ED  TROPONIN I (HIGH SENSITIVITY)   ____________________________________________  EKG  ED ECG REPORT I, Blake Divine, the attending physician, personally viewed and interpreted this ECG.   Date: 08/05/2019  EKG Time: 14:45  Rate: 98  Rhythm: normal sinus rhythm  Axis: Normal  Intervals:none  ST&T Change: None   PROCEDURES  Procedure(s) performed (including Critical Care):  Procedures   ____________________________________________   INITIAL IMPRESSION / ASSESSMENT AND PLAN / ED COURSE       73 year old male with past medical history of recurrent aspiration requiring placement of PEG tube presents to the ED with nausea and vomiting over the past  couple of days, subsequently developed cough, shortness of breath, and pleuritic chest pain.  He reports feeling out of breath very easily at home when walking up the stairs and was noted to be hypoxic here in the ED.  He was placed on 2 L nasal cannula with improvement in his O2 sats and minimal increase in his work of breathing currently.  Chest x-ray concerning for right-sided pneumonia with small effusion and I suspect recurrent aspiration given his vomiting.  He meets sepsis criteria, will draw blood cultures and start IV fluid administration.  Will treat with Unasyn given concern for aspiration pneumonia, perform COVID-19 testing although lower suspicion for this.  EKG without acute ischemic changes and troponin within normal limits, doubt his chest pain signifies ACS.  Remainder of his labs are significant for mild hyponatremia, lactic acid within normal limits.  He will require admission given his hypoxia.  Case discussed with hospitalist, who accepts patient for admission.      ____________________________________________   FINAL CLINICAL IMPRESSION(S) / ED DIAGNOSES  Final diagnoses:  Aspiration pneumonia of right lung, unspecified aspiration pneumonia type, unspecified part of lung ([place])  Hypoxia  Recurrent aspiration pneumonia [facility])     ED Discharge Orders    None       Note:  This document was prepared using Dragon voice recognition software and may include unintentional dictation errors.   Blake Divine, MD 08/05/19 1950

## 2019-08-05 NOTE — Progress Notes (Signed)
CODE SEPSIS - PHARMACY COMMUNICATION  **Broad Spectrum Antibiotics should be administered within 1 hour of Sepsis diagnosis**  Time Code Sepsis Called/Page Received: [id number]  Antibiotics Ordered: unasyn   Time of 1st antibiotic administration: 1851  Additional action taken by pharmacy: n/a   If necessary, Name of Provider/Nurse Contacted: n/a    Rocky Morel ,PharmD Clinical Pharmacist  08/05/2019  6:46 PM

## 2019-08-05 NOTE — ED Notes (Signed)
Pt sleeping in [place], easily aroused. Denies worsening symptoms

## 2019-08-06 ENCOUNTER — Inpatient Hospital Stay: Payer: Medicare Other

## 2019-08-06 ENCOUNTER — Encounter: Payer: Self-pay | Admitting: Emergency Medicine

## 2019-08-06 DIAGNOSIS — K219 Gastro-esophageal reflux disease without esophagitis: Secondary | ICD-10-CM

## 2019-08-06 DIAGNOSIS — E039 Hypothyroidism, unspecified: Secondary | ICD-10-CM

## 2019-08-06 DIAGNOSIS — E871 Hypo-osmolality and hyponatremia: Secondary | ICD-10-CM

## 2019-08-06 DIAGNOSIS — R112 Nausea with vomiting, unspecified: Secondary | ICD-10-CM

## 2019-08-06 LAB — COMPREHENSIVE METABOLIC PANEL
ALT: 31 U/L (ref 0–44)
AST: 24 U/L (ref 15–41)
Albumin: 2.8 g/dL — ABNORMAL LOW (ref 3.5–5.0)
Alkaline Phosphatase: 143 U/L — ABNORMAL HIGH (ref 38–126)
Anion gap: 3 — ABNORMAL LOW (ref 5–15)
BUN: 13 mg/dL (ref 8–23)
CO2: 27 mmol/L (ref 22–32)
Calcium: 8.6 mg/dL — ABNORMAL LOW (ref 8.9–10.3)
Chloride: 100 mmol/L (ref 98–111)
Creatinine, Ser: 0.6 mg/dL — ABNORMAL LOW (ref 0.61–1.24)
GFR calc Af Amer: >60 mL/min (ref >60)
GFR calc non Af Amer: >60 mL/min (ref >60)
Glucose, Bld: 101 mg/dL — ABNORMAL HIGH (ref 70–99)
Potassium: 4.4 mmol/L (ref 3.5–5.1)
Sodium: 130 mmol/L — ABNORMAL LOW (ref 135–145)
Total Bilirubin: 0.7 mg/dL (ref 0.3–1.2)
Total Protein: 7.1 g/dL (ref 6.5–8.1)

## 2019-08-06 LAB — CBC
HCT: 35.9 % — ABNORMAL LOW (ref 39.0–52.0)
Hemoglobin: 12.4 g/dL — ABNORMAL LOW (ref 13.0–17.0)
MCH: 30.1 pg (ref 26.0–34.0)
MCHC: 34.5 g/dL (ref 30.0–36.0)
MCV: 87.1 fL (ref 80.0–100.0)
Platelets: 285 10*3/uL (ref 150–400)
RBC: 4.12 MIL/uL — ABNORMAL LOW (ref 4.22–5.81)
RDW: 12.4 % (ref 11.5–15.5)
WBC: 10.3 10*3/uL (ref 4.0–10.5)
nRBC: 0 % (ref 0.0–0.2)

## 2019-08-06 LAB — TSH: TSH: 2.357 u[IU]/mL (ref 0.350–4.500)

## 2019-08-06 LAB — SARS CORONAVIRUS 2 (TAT 6-24 HRS): SARS Coronavirus 2: NEGATIVE

## 2019-08-06 MED ORDER — TRAZODONE HCL 50 MG PO TABS
25.0000 mg | ORAL_TABLET | Freq: Every evening | ORAL | Status: DC | PRN
  Filled 2019-08-06: qty 0.5

## 2019-08-06 MED ORDER — PANTOPRAZOLE SODIUM 40 MG PO PACK
40.0000 mg | PACK | Freq: Every day | ORAL | Status: DC
  Administered 2019-08-06: 18:00:00 40 mg
  Filled 2019-08-06 (×3): qty 20

## 2019-08-06 MED ORDER — LEVOTHYROXINE SODIUM 50 MCG PO TABS
75.0000 ug | ORAL_TABLET | Freq: Every day | ORAL | Status: DC
  Administered 2019-08-08 – 2019-08-12 (×5): 75 ug
  Filled 2019-08-06 (×5): qty 2
  Filled 2019-08-06: qty 1

## 2019-08-06 MED ORDER — JEVITY 1.5 CAL/FIBER PO LIQD
237.0000 mL | Freq: Every day | ORAL | Status: DC
  Administered 2019-08-06 (×2): 237 mL

## 2019-08-06 MED ORDER — FREE WATER
60.0000 mL | Freq: Two times a day (BID) | Status: DC
  Administered 2019-08-06 – 2019-08-07 (×2): 60 mL
  Filled 2019-08-06: qty 60

## 2019-08-06 MED ORDER — MAGNESIUM HYDROXIDE 400 MG/5ML PO SUSP
30.0000 mL | Freq: Every day | ORAL | Status: DC | PRN
  Filled 2019-08-06: qty 30

## 2019-08-06 MED ORDER — LANSOPRAZOLE 3 MG/ML SUSP: 15.0000 mg | Freq: Every day | ORAL | Status: DC

## 2019-08-06 NOTE — ED Notes (Signed)
Pt on phone with dietitian

## 2019-08-06 NOTE — ED Notes (Signed)
Patient given supplies to change g-tube dressing. Site clean and dry upon inspection. No further needs expressed at this time.

## 2019-08-06 NOTE — Progress Notes (Signed)
Initial Nutrition Assessment  DOCUMENTATION CODES:   Not applicable  INTERVENTION:  Provide Jevity 1.5 Cal 1 can 6 times daily per G-tube. Provides 2130 kcal, 91 grams of protein, 30 grams of fiber, and 1080 mL H2O daily.  Flush tube with 60 mL of water before and after each bolus tube feeding. Also provide an additional 60 mL of water twice daily between feeds. This provides a total of 1920 mL H2O daily including water in tube feeding regimen, and is approximately the same amount of water patient receives at home from his tube feed regimen.  Goal TF regimen meets 100% RDIs for vitamins/minerals.  NUTRITION DIAGNOSIS:   Inadequate oral intake related to dysphagia, inability to eat as evidenced by NPO status(reliance on tube feeds for kcal/protein/hydration needs).  GOAL:   Patient will meet greater than or equal to 90% of their needs  MONITOR:   Labs, Weight trends, TF tolerance, I & O's  REASON FOR ASSESSMENT:   Consult Enteral/tube feeding initiation and management  ASSESSMENT:   73 year old male with PMHx of GERD, hypothyroidism, throat cancer s/p tonsillectomy, dysphagia with recurrent aspiration s/p G-tube placement admitted with aspiration PNA, mild sepsis.   Patient has been admitted to inpatient status but remains in ED waiting on a bed. Spoke with him over the phone. He reports he has a hx of throat cancer s/p tonsillectomy. He reports he has finished treatment. His G-tube was placed approximately 6 months ago at a hospital in [place], [place]. His regimen is Jevity 1.5 Cal 2 cans TID. He flushes with approximately 70 mL before and after each tube feeding and then gives an additional 200 mL BID between feeds. This provides a total of approximately 1900 mL water daily including the water in the tube feeding. He endorses he did discuss with MD about reduced risk for aspiration if the feeds are spread out throughout the day more and he is amenable to trying 1 can 6 times daily.  He does not eat by mouth but does report he takes medications by mouth with water, which he feels is not safe. He would prefer all medications be switched to per tube if possible.   Patient reports his UBW prior to having cancer was 155 lbs. He reports he lost down to 133 lbs prior to the G-tube being placed. Over the past 6 months he has gained back to his UBW. Current weight in chart is 68.9 kg (152 lbs). However according to chart patient was 75.5 kg (166.1 lbs) on 09/09/2018 so he may have lost more weight than he realized initially.  Enteral Access: G-tube placed approximately 6 months ago according to patient  Medications reviewed and include: levothyroxine, pantoprazole, NS at 50 mL/hr, Unasyn, azithromycin.  Labs reviewed: Sodium 130.  Patient is at risk for malnutrition but unable to determine if he meets criteria for malnutrition at this time.  NUTRITION - FOCUSED PHYSICAL EXAM:  Unable to complete at this time.  Diet Order:   Diet Order            Diet NPO time specified Except for: Sips with Meds  Diet effective now             EDUCATION NEEDS:   No education needs have been identified at this time  Skin:  Skin Assessment: Reviewed RN Assessment(not yet completed)  Last BM:  Unknown  Height:   Ht Readings from Last 1 Encounters:  08/05/19 5\' 11"  (1.803 m)   Weight:  Wt Readings from Last 1 Encounters:  08/05/19 68.9 kg   Ideal Body Weight:  78.2 kg  BMI:  Body mass index is 21.2 kg/m.  Estimated Nutritional Needs:   Kcal:  1905-2200 (MSJ x 1.3-1.5)  Protein:  83-103 grams (1.2-1.5 grams/kg)  Fluid:  1.7-2 L/day (25-30 mL/kg)  Jacklynn Barnacle, MS, RD, LDN Office: [phone] Pager: [phone] After Hours/Weekend Pager: [phone]

## 2019-08-06 NOTE — Progress Notes (Signed)
Patient ID: Mark Andrews, male   DOB: 1945/09/19, 73 y.o.   MRN: [id number] [facility] PROGRESS NOTE  Mark Andrews [id number] DOB: 10/10/1945 DOA: 08/05/2019 PCP: System, Pcp Not In  HPI/Subjective: Patient stated that he had some vomiting after doing his tube feeding.  He normally does 2 cans 3 times a day.  Normally the vomiting is after the tube feeding.  Objective: Vitals:   08/06/19 0230 08/06/19 0811  BP: (!) 126/92 (!) 159/99  Pulse: 82 78  Resp: (!) 25 (!) 22  Temp: 98.7 F (37.1 C) 98.3 F (36.8 C)  SpO2: 97% 95%    Intake/Output Summary (Last 24 hours) at 08/06/2019 1658 Last data filed at 08/06/2019 1347 Gross per 24 hour  Intake 1011.42 ml  Output 1625 ml  Net -613.58 ml   Filed Weights   08/05/19 1431  Weight: 68.9 kg    ROS: Review of Systems  Constitutional: Negative for chills and fever.  Eyes: Negative for blurred vision.  Respiratory: Negative for cough and shortness of breath.   Cardiovascular: Negative for chest pain.  Gastrointestinal: Positive for nausea. Negative for abdominal pain, constipation, diarrhea and vomiting.  Genitourinary: Negative for dysuria.  Musculoskeletal: Negative for joint pain.  Neurological: Negative for dizziness and headaches.   Exam: Physical Exam  Constitutional: He is oriented to person, place, and time.  HENT:  Nose: No mucosal edema.  Mouth/Throat: No oropharyngeal exudate or posterior oropharyngeal edema.  Eyes: Pupils are equal, round, and reactive to light. Conjunctivae, EOM and lids are normal.  Neck: No JVD present. Carotid bruit is not present. No edema present. No thyroid mass and no thyromegaly present.  Cardiovascular: S1 normal and S2 normal. Exam reveals no gallop.  No murmur heard. Pulses:      Dorsalis pedis pulses are 2+ on the right side and 2+ on the left side.  Respiratory: No respiratory distress. He has decreased breath sounds in the right lower field and the left lower field. He has no  wheezes. He has no rhonchi. He has no rales.  GI: Soft. Bowel sounds are normal. There is no abdominal tenderness.  Musculoskeletal:     Right ankle: He exhibits no swelling.     Left ankle: He exhibits no swelling.  Lymphadenopathy:    He has no cervical adenopathy.  Neurological: He is alert and oriented to person, place, and time. No cranial nerve deficit.  Skin: Skin is warm. No rash noted. Nails show no clubbing.  Psychiatric: He has a normal mood and affect.      Data Reviewed: Basic Metabolic Panel: Recent Labs  Lab 08/05/19 1437 08/06/19 0620  NA 128* 130*  K 4.6 4.4  CL 88* 100  CO2 28 27  GLUCOSE 108* 101*  BUN 18 13  CREATININE 0.94 0.60*  CALCIUM 8.8* 8.6*   Liver Function Tests: Recent Labs  Lab 08/06/19 0620  AST 24  ALT 31  ALKPHOS 143*  BILITOT 0.7  PROT 7.1  ALBUMIN 2.8*   CBC: Recent Labs  Lab 08/05/19 1437 08/06/19 0620  WBC 11.9* 10.3  NEUTROABS 9.1*  --   HGB 13.8 12.4*  HCT 41.3 35.9*  MCV 90.2 87.1  PLT 361 285   BNP (last 3 results) Recent Labs    09/07/18 2258  BNP 57.0      Recent Results (from the past 240 hour(s))  Blood Culture (routine x 2)     Status: None (Preliminary result)   Collection Time: 08/05/19  6:47 PM  Specimen: BLOOD  Result Value Ref Range Status   Specimen Description BLOOD RIGHT ANTECUBITAL  Final   Special Requests   Final    BOTTLES DRAWN AEROBIC AND ANAEROBIC Blood Culture adequate volume   Culture   Final    NO GROWTH < 12 HOURS Performed at [facility], [address] [place], [place] [address]    Report Status PENDING  Incomplete  Blood Culture (routine x 2)     Status: None (Preliminary result)   Collection Time: 08/05/19  6:47 PM   Specimen: BLOOD  Result Value Ref Range Status   Specimen Description BLOOD BLOOD LEFT FOREARM  Final   Special Requests   Final    BOTTLES DRAWN AEROBIC AND ANAEROBIC Blood Culture adequate volume   Culture   Final    NO GROWTH < 12  HOURS Performed at [facility], [place], [place] [address]    Report Status PENDING  Incomplete  SARS CORONAVIRUS 2 (TAT 6-24 HRS) Nasopharyngeal Nasopharyngeal Swab     Status: None   Collection Time: 08/05/19  8:33 PM   Specimen: Nasopharyngeal Swab  Result Value Ref Range Status   SARS Coronavirus 2 NEGATIVE NEGATIVE Final    Comment: (NOTE) SARS-CoV-2 target nucleic acids are NOT DETECTED. The SARS-CoV-2 RNA is generally detectable in upper and lower respiratory specimens during the acute phase of infection. Negative results do not preclude SARS-CoV-2 infection, do not rule out co-infections with other pathogens, and should not be used as the sole basis for treatment or other patient management decisions. Negative results must be combined with clinical observations, patient history, and epidemiological information. The expected result is Negative. Fact Sheet for Patients: SugarRoll.be Fact Sheet for Healthcare Providers: https://www.woods-mathews.com/ This test is not yet approved or cleared by the [place] FDA and  has been authorized for detection and/or diagnosis of SARS-CoV-2 by FDA under an Emergency Use Authorization (EUA). This EUA will remain  in effect (meaning this test can be used) for the duration of the COVID-19 declaration under Section 56 4(b)(1) of the Act, 21 U.S.C. section 360bbb-3(b)(1), unless the authorization is terminated or revoked sooner. Performed at [facility], [place] [address], [place], [place] [address]   Culture, sputum-assessment     Status: None   Collection Time: 08/05/19  8:33 PM   Specimen: Sputum  Result Value Ref Range Status   Specimen Description SPUTUM  Final   Special Requests NONE  Final   Sputum evaluation   Final    THIS SPECIMEN IS ACCEPTABLE FOR SPUTUM CULTURE Performed at [facility], [address], [place], [place] [address]     Report Status 08/05/2019 FINAL  Final  Culture, respiratory     Status: None (Preliminary result)   Collection Time: 08/05/19  8:33 PM   Specimen: SPU  Result Value Ref Range Status   Specimen Description   Final    SPUTUM Performed at [facility], [name] [place], [place] [address]    Special Requests   Final    NONE Reflexed from [id number] Performed at [facility], [address] [place], [place] [address]    Gram Stain   Final    MODERATE WBC PRESENT, PREDOMINANTLY PMN MODERATE GRAM POSITIVE COCCI IN CLUSTERS IN CHAINS FEW GRAM NEGATIVE RODS FEW GRAM POSITIVE RODS FEW BUDDING YEAST SEEN Performed at [facility], [place] [address] [place], [place] [address]    Culture PENDING  Incomplete  Report Status PENDING  Incomplete     Studies: Dg Chest 2 View  Result Date: 08/05/2019 CLINICAL DATA:  Shortness of breath. EXAM: CHEST - 2 VIEW COMPARISON:  September 07, 2018. FINDINGS: The heart size and mediastinal contours are within normal limits. No pneumothorax is noted. Small right pleural effusion is noted. Stable bibasilar scarring is noted. Faint right upper lobe opacity is noted which may represent pneumonia. The visualized skeletal structures are unremarkable. IMPRESSION: Small right pleural effusion is noted. Stable bibasilar scarring is noted. Faint right upper lobe opacity is noted which may represent pneumonia. Continued radiographic follow-up is recommended to ensure resolution. Electronically Signed   By: Marijo Conception M.D.   On: 08/05/2019 15:05   US Abdomen Limited Ruq  Result Date: 08/06/2019 CLINICAL DATA:  Nausea, vomiting and shortness of breath for 1 week EXAM: ULTRASOUND ABDOMEN LIMITED RIGHT UPPER QUADRANT COMPARISON:  Chest angiography 09/08/2018 FINDINGS: Gallbladder: The gallbladder contains multiple calcified shadowing gallstones, largest measuring up to 1.7 cm in maximal diameter. No gallbladder wall thickening or pericholecystic  fluid is seen. Sonographic Percell Miller sign is reportedly negative Common bile duct: Diameter: 2.2 mm, nondilated Liver: There is a coarsened, heterogeneous echotexture of the liver with some mild surface nodularity. No focal liver lesions are seen. Portal vein is patent on color Doppler imaging with normal direction of blood flow towards the liver. Other: None. IMPRESSION: Cholelithiasis without sonographic evidence of acute cholecystitis. Heterogeneous, coarsened and nodular liver, may suggest underlying intrinsic liver disease such as cirrhosis. Electronically Signed   By: Lovena Le M.D.   On: 08/06/2019 13:44    Scheduled Meds: . enoxaparin (LOVENOX) injection  40 mg Subcutaneous Q24H  . feeding supplement (JEVITY 1.5 CAL/FIBER)  237 mL Per Tube 6 X Daily  . free water  60 mL Per Tube BID  . [START ON 08/07/2019] levothyroxine  75 mcg Per Tube Q0600  . pantoprazole sodium  40 mg Per Tube Daily  . sodium fluoride  1 application dental Daily   Continuous Infusions: . sodium chloride 50 mL/hr at 08/06/19 0914  . ampicillin-sulbactam (UNASYN) IV Stopped (08/06/19 1615)  . azithromycin Stopped (08/06/19 0228)    Assessment/Plan:  1. Aspiration pneumonia from tube feeds.  Continue Unasyn and Zithromax. 2. Hyponatremia.  Gentle IV fluid hydration. 3. Hypothyroidism on levothyroxine 4. GERD on Protonix 5. Nausea vomiting as needed Zofran.  Right upper quadrant ultrasound does not show acute cholecystitis but may show an underlying cirrhosis.  Send off hepatitis profiles.  I may consider a CT scan of the chest abdomen pelvis tomorrow. 6. History of throat cancer with dysphagia with feeding tube.  Dietary consult to restart tube feeds.  Code Status:     Code Status Orders  (From admission, onward)         Start     Ordered   08/05/19 2010  Full code  Continuous     08/05/19 2019        Code Status History    Date Active Date Inactive Code Status Order ID Comments User Context    09/08/2018 0404 09/09/2018 1913 Full Code [id number]  Harrie Foreman, MD Inpatient   Advance Care Planning Activity    Advance Directive Documentation     Most Recent Value  Type of Advance Directive  Healthcare Power of Attorney, Living will  Pre-existing out of facility DNR order (yellow form or pink MOST form)  -  "MOST" Form in Place?  -     Family Communication:  Patient deferred me calling family at this time Disposition Plan: To be determined  Antibiotics:  Unasyn  Zithromax  Time spent: 28 minutes  [place]

## 2019-08-06 NOTE — ED Notes (Signed)
Patient transferred into hospital bed. Able to stand and take a few steps with standby assistance only. Patient with cell phone in lap and plugged into bed. No further needs expressed at this time.

## 2019-08-06 NOTE — Progress Notes (Signed)
   08/06/19 2153  Vitals  Temp 98.5 F (36.9 C)  Temp Source Oral  BP (!) 137/100  MAP (mmHg) 112  BP Location Right Arm  BP Method Automatic  Patient Position (if appropriate) Lying  Pulse Rate (!) 104  Pulse Rate Source Monitor  Resp 20  Oxygen Therapy  SpO2 91 %  O2 Device Nasal Cannula  O2 Flow Rate (L/min) 4 L/min  MEWS Score  MEWS RR 0  MEWS Pulse 1  MEWS Systolic 0  MEWS LOC 0  MEWS Temp 0  MEWS Score 1  MEWS Score Color Green   Patient vomited last amount approximately 30 mins after tube feed given.  IV zofran given with improvement in nausea.  Patient feeling short of breath, diminished breath sounds in the base of lungs bilaterally. 89% on 2L O2 via [place].  Increased O2 to 4L, sats 91-92%.  Paged Stark Klein, NP, new orders received.  She will be by to assess patient after CXR.  Clarise Cruz, BSN

## 2019-08-07 ENCOUNTER — Inpatient Hospital Stay: Payer: Medicare Other

## 2019-08-07 ENCOUNTER — Encounter: Payer: Self-pay | Admitting: Family Medicine

## 2019-08-07 DIAGNOSIS — J9601 Acute respiratory failure with hypoxia: Secondary | ICD-10-CM

## 2019-08-07 DIAGNOSIS — R590 Localized enlarged lymph nodes: Secondary | ICD-10-CM

## 2019-08-07 DIAGNOSIS — J690 Pneumonitis due to inhalation of food and vomit: Secondary | ICD-10-CM

## 2019-08-07 LAB — MRSA PCR SCREENING: MRSA by PCR: NEGATIVE

## 2019-08-07 LAB — HEPATITIS B SURFACE ANTIGEN: Hepatitis B Surface Ag: NONREACTIVE

## 2019-08-07 LAB — HEPATITIS B CORE ANTIBODY, TOTAL: Hep B Core Total Ab: NONREACTIVE

## 2019-08-07 LAB — LEGIONELLA PNEUMOPHILA SEROGP 1 UR AG: L. pneumophila Serogp 1 Ur Ag: NEGATIVE

## 2019-08-07 LAB — GLUCOSE, CAPILLARY: Glucose-Capillary: 128 mg/dL — ABNORMAL HIGH (ref 70–99)

## 2019-08-07 LAB — HEPATITIS C ANTIBODY: HCV Ab: NONREACTIVE

## 2019-08-07 MED ORDER — ALBUTEROL SULFATE (2.5 MG/3ML) 0.083% IN NEBU: 2.5000 mg | INHALATION_SOLUTION | RESPIRATORY_TRACT | Status: DC | PRN

## 2019-08-07 MED ORDER — IPRATROPIUM-ALBUTEROL 0.5-2.5 (3) MG/3ML IN SOLN
3.0000 mL | RESPIRATORY_TRACT | Status: DC | PRN
  Administered 2019-08-07 (×2): 3 mL via RESPIRATORY_TRACT
  Filled 2019-08-07: qty 3

## 2019-08-07 MED ORDER — PANTOPRAZOLE SODIUM 40 MG IV SOLR
40.0000 mg | INTRAVENOUS | Status: DC
  Administered 2019-08-07 – 2019-08-12 (×6): 40 mg via INTRAVENOUS
  Filled 2019-08-07 (×6): qty 40

## 2019-08-07 MED ORDER — METHYLPREDNISOLONE SODIUM SUCC 40 MG IJ SOLR
40.0000 mg | Freq: Once | INTRAMUSCULAR | Status: AC
  Administered 2019-08-07: 01:00:00 40 mg via INTRAVENOUS
  Filled 2019-08-07: qty 1

## 2019-08-07 MED ORDER — IOHEXOL 350 MG/ML SOLN
75.0000 mL | Freq: Once | INTRAVENOUS | Status: AC | PRN
  Administered 2019-08-07: 75 mL via INTRAVENOUS

## 2019-08-07 MED ORDER — ORAL CARE MOUTH RINSE
15.0000 mL | Freq: Two times a day (BID) | OROMUCOSAL | Status: DC
  Administered 2019-08-07 – 2019-08-12 (×10): 15 mL via OROMUCOSAL

## 2019-08-07 MED ORDER — FLUCONAZOLE 100MG IVPB
100.0000 mg | INTRAVENOUS | Status: DC
  Administered 2019-08-08 – 2019-08-12 (×5): 100 mg via INTRAVENOUS
  Filled 2019-08-07 (×6): qty 50

## 2019-08-07 MED ORDER — BUDESONIDE 0.5 MG/2ML IN SUSP
0.5000 mg | Freq: Two times a day (BID) | RESPIRATORY_TRACT | Status: DC
  Administered 2019-08-07 – 2019-08-12 (×9): 0.5 mg via RESPIRATORY_TRACT
  Filled 2019-08-07 (×9): qty 2

## 2019-08-07 MED ORDER — IPRATROPIUM-ALBUTEROL 0.5-2.5 (3) MG/3ML IN SOLN
3.0000 mL | Freq: Four times a day (QID) | RESPIRATORY_TRACT | Status: DC
  Filled 2019-08-07: qty 3

## 2019-08-07 MED ORDER — METOCLOPRAMIDE HCL 5 MG/ML IJ SOLN
5.0000 mg | Freq: Four times a day (QID) | INTRAMUSCULAR | Status: DC
  Administered 2019-08-07 – 2019-08-09 (×7): 5 mg via INTRAVENOUS
  Filled 2019-08-07 (×10): qty 2

## 2019-08-07 MED ORDER — ALPRAZOLAM 0.25 MG PO TABS
0.2500 mg | ORAL_TABLET | Freq: Three times a day (TID) | ORAL | Status: DC | PRN
  Administered 2019-08-07: 0.25 mg
  Filled 2019-08-07: qty 1

## 2019-08-07 MED ORDER — CHLORHEXIDINE GLUCONATE CLOTH 2 % EX PADS
6.0000 | MEDICATED_PAD | Freq: Every day | CUTANEOUS | Status: DC
  Administered 2019-08-07 – 2019-08-12 (×5): 6 via TOPICAL

## 2019-08-07 MED ORDER — FLUCONAZOLE IN SODIUM CHLORIDE 200-0.9 MG/100ML-% IV SOLN
200.0000 mg | Freq: Once | INTRAVENOUS | Status: AC
  Administered 2019-08-07: 200 mg via INTRAVENOUS
  Filled 2019-08-07: qty 100

## 2019-08-07 NOTE — Progress Notes (Signed)
Soon after being placed on NRB patient had another episode of vomiting.  Lungs sounds are now rhonchi and patient continues to be in respiratory distress. Abnormal ABG.  Rapid response called for quick movement to stepdown unit. Patient transported to ICU 14 by this nurse and Rise Paganini, [place]. Stark Klein, NP met [place] on the elevator and accompanied [place] to ICU.  Report given to Vicente Males, Therapist, sports.  Clarise Cruz, BSN

## 2019-08-07 NOTE — Progress Notes (Signed)
Patient has improved breathing. RR 15-25. Patient is talking calmly to wife at bedside.

## 2019-08-07 NOTE — Progress Notes (Signed)
Mark Hey, MD for increased work of breathing. Patient also complaining of SOB. Saturation 99%, and RR 30-46. Patient also very anxious, and fidgety. Requested med for anxiety be provided to provide optimal breathing pattern. Will continue to monitor.

## 2019-08-07 NOTE — Progress Notes (Signed)
Maudie Mercury, RN into patient room to give schedule IV solumedrol.  Patient with respirations of 38/min and  84% on 4L O2 via [place].  Increased O2 to 6L with no improvement.  Paged Stark Klein, NP and received new orders.  Duo neb given once stat ABG obtained by Merry Proud, RT.  Patient placed on NRB with stats improved to 100%.  Clarise Cruz, BSN

## 2019-08-07 NOTE — Progress Notes (Signed)
Rapid Response Event Note  Overview:Pt lying in bed awake with NRB in place RR35/min HR 130's.pt states having difficulty breathing nursing stated that pt had some vomiting this shift       Initial Focused Assessment:   Interventions:Pt with continued WOB was transferred to SD.   Plan of Care (if not transferred): Pt brought to room 14 and placed on [place] nasal canulla Event Summary:   at  0133    at          [facility]

## 2019-08-07 NOTE — Progress Notes (Signed)
Escorted patient to CT scan on non rebreather and back. No signs of distress. Patient resting comfortably with wife at bedside.

## 2019-08-07 NOTE — Progress Notes (Signed)
    BRIEF OVERNIGHT PROGRESS REPORT   SUBJECTIVE: Per nursing staff, patient became tachypneic, tachycardic, complained of worsening shortness of breath and hypoxic with increasing oxygen requirements.  OBJECTIVE: On arrival to the bedside, he was febrile with blood pressure 111/71 mm Hg and pulse rate 115 beats/min. There were no focal neurological deficits; he was alert and oriented x4, but SOB.  ASSESSMENT:  PLAN: 1. Acute respiratory failure with hypoxia -likely secondary to worsening aspiration pneumonia from recurrent vomiting. -Transfer to stepdown unit - Repeat chest x-ray shows worsening of the RUL density concerning for developing infiltrate - ABGs obtained showed hypoxia - Received IV Solu-Medrol 40 mg x 1 dose and duo nebs - Placed patient on HFNC with resolution of hypoxia - Continue supplemental oxygen weaning as tolerated. - We will continue aggressive pulmonary toileting - Aspiration precautions. - Hold tube feeds for now - PRN antiemetics - Continue current empiric treatment for aspiration pneumonia with Unasyn and Zithromax    Rufina Falco, DNP, CCRN, FNP-C [facility] Nurse Practitioner Between 7pm to [place] - Pager [phone]  After 7am go to [email or web address] - [email or web address] select [facility]  [facility] [employer]  [phone]

## 2019-08-07 NOTE — Consult Note (Signed)
Name: Mark Andrews MRN: [id number] DOB: April 12, 1946     CONSULTATION DATE: 08/07/2019  REFERRING MD : weiting  CHIEF COMPLAINT: SOB  HISTORY OF PRESENT ILLNESS:  72 y.o. Male with a PMH notable for throat cancer with dysphagia requiring a feeding tube.  He was admitted to Med-Surg unit with Aspiration PNA. Pt was transferred to [facility] unit early this morning 08/07/19 by Hospitalist as pt had vomiting episode on the floor following his tube feedings, with subsequent aspiration.  He became tachycardic, short of breath, and hypoxic with increasing FiO2 requirements.  Follow up CXR shows worsening of the RUL density concerning for developing infiltrate. ABG with hypoxia, otherwise normal.  He was given a dose of solu-medrol and he remains on Unasyn and Azithromycin for aspiration coverage.  Upon arrival to ICU he was transitioned to HFNC.  He now appears more comfortable, RR mid 20's, and hypoxia has resolved with O2 sats 97%  We were asked to consult for hypoxia. At this time, patient is improving with time, IV abx and steroids and NEB therapy  PAST MEDICAL HISTORY :   has a past medical history of Aspiration into respiratory tract, Cancer ([place]), GERD (gastroesophageal reflux disease), and Hypothyroidism.  has a past surgical history that includes none; Gastrostomy tube placement; and Tonsillectomy. Prior to Admission medications   Medication Sig Start Date End Date Taking? Authorizing Provider  levothyroxine (SYNTHROID, LEVOTHROID) 75 MCG tablet Take 75 mcg by mouth daily before breakfast.   Yes [provider]  pantoprazole (PROTONIX) 40 MG tablet Take 40 mg by mouth daily.   Yes [provider]  sodium fluoride (FLUORISHIELD) 1.1 % GEL dental gel Place 1 application onto teeth daily.   Yes [provider]   No Known Allergies  FAMILY HISTORY:  family history includes Pulmonary embolism in his mother. SOCIAL HISTORY:  reports that he has never smoked. He  has never used smokeless tobacco. He reports previous alcohol use. He reports previous drug use.    Review of Systems:  Gen:  Denies  fever, sweats, chills weigh loss  HEENT: Denies blurred vision, double vision, ear pain, eye pain, hearing loss, nose bleeds, sore throat Cardiac:  No dizziness, chest pain or heaviness, chest tightness,edema, No JVD Resp:   No cough, -sputum production, +shortness of breath,-wheezing, -hemoptysis,  Gi: Denies swallowing difficulty, stomach pain, nausea or vomiting, diarrhea, constipation, bowel incontinence Gu:  Denies bladder incontinence, burning urine Ext:   Denies Joint pain, stiffness or swelling Skin: Denies  skin rash, easy bruising or bleeding or hives Endoc:  Denies polyuria, polydipsia , polyphagia or weight change Psych:   Denies depression, insomnia or hallucinations  Other:  All other systems negative     VITAL SIGNS: Temp:  [97.8 F (36.6 C)-101.7 F (38.7 C)] 97.9 F (36.6 C) (12/10 1300) Pulse Rate:  [70-115] 70 (12/10 1300) Resp:  [16-41] 16 (12/10 1300) BP: (93-176)/(59-104) 111/74 (12/10 1300) SpO2:  [91 %-100 %] 96 % (12/10 1300) FiO2 (%):  [50 %-96 %] 50 % (12/10 1200) Weight:  [69.9 kg] 69.9 kg (12/10 0300)     SpO2: 96 % O2 Flow Rate (L/min): 40 L/min FiO2 (%): 50 %    Physical Examination:   GENERAL:NAD, no fevers, chills, no weakness no fatigue HEAD: Normocephalic, atraumatic.  EYES: PERLA, EOMI No scleral icterus.  MOUTH: Moist mucosal membrane.  EAR, NOSE, THROAT: Clear without exudates. No external lesions.  NECK: Supple.  PULMONARY: CTA B/L no wheezing, rhonchi, crackles CARDIOVASCULAR: S1 and S2.  Regular rate and rhythm. No murmurs GASTROINTESTINAL: Soft, nontender, nondistended. Positive bowel sounds.  MUSCULOSKELETAL: No swelling, clubbing, or edema.  NEUROLOGIC: No gross focal neurological deficits. 5/5 strength all extremities SKIN: No ulceration, lesions, rashes, or cyanosis.  PSYCHIATRIC:  Insight, judgment intact. -depression -anxiety ALL OTHER ROS ARE NEGATIVE   MEDICATIONS: I have reviewed all medications and confirmed regimen as documented    CULTURE RESULTS   Recent Results (from the past 240 hour(s))  Urine culture     Status: None (Preliminary result)   Collection Time: 08/05/19  6:36 PM   Specimen: In/Out Cath Urine  Result Value Ref Range Status   Specimen Description   Final    IN/OUT CATH URINE Performed at [facility], [address] [place], [place] [address]    Special Requests   Final    NONE Performed at [facility], [address], [place], [place] [address]    Culture   Final    CULTURE REINCUBATED FOR BETTER GROWTH Performed at [facility], [place] [address], [place], [place] [address]    Report Status PENDING  Incomplete  Blood Culture (routine x 2)     Status: None (Preliminary result)   Collection Time: 08/05/19  6:47 PM   Specimen: BLOOD  Result Value Ref Range Status   Specimen Description BLOOD RIGHT ANTECUBITAL  Final   Special Requests   Final    BOTTLES DRAWN AEROBIC AND ANAEROBIC Blood Culture adequate volume   Culture   Final    NO GROWTH 2 DAYS Performed at [facility], [address], [place], [place] [address]    Report Status PENDING  Incomplete  Blood Culture (routine x 2)     Status: None (Preliminary result)   Collection Time: 08/05/19  6:47 PM   Specimen: BLOOD  Result Value Ref Range Status   Specimen Description BLOOD BLOOD LEFT FOREARM  Final   Special Requests   Final    BOTTLES DRAWN AEROBIC AND ANAEROBIC Blood Culture adequate volume   Culture   Final    NO GROWTH 2 DAYS Performed at [facility], [address], [place], [place] [address]    Report Status PENDING  Incomplete  SARS CORONAVIRUS 2 (TAT 6-24 HRS) Nasopharyngeal Nasopharyngeal Swab     Status: None   Collection Time: 08/05/19  8:33 PM   Specimen: Nasopharyngeal Swab  Result Value Ref Range  Status   SARS Coronavirus 2 NEGATIVE NEGATIVE Final    Comment: (NOTE) SARS-CoV-2 target nucleic acids are NOT DETECTED. The SARS-CoV-2 RNA is generally detectable in upper and lower respiratory specimens during the acute phase of infection. Negative results do not preclude SARS-CoV-2 infection, do not rule out co-infections with other pathogens, and should not be used as the sole basis for treatment or other patient management decisions. Negative results must be combined with clinical observations, patient history, and epidemiological information. The expected result is Negative. Fact Sheet for Patients: SugarRoll.be Fact Sheet for Healthcare Providers: https://www.woods-mathews.com/ This test is not yet approved or cleared by the [place] FDA and  has been authorized for detection and/or diagnosis of SARS-CoV-2 by FDA under an Emergency Use Authorization (EUA). This EUA will remain  in effect (meaning this test can be used) for the duration of the COVID-19 declaration under Section 56 4(b)(1) of the Act, 21 U.S.C. section 360bbb-3(b)(1), unless the authorization is terminated or revoked sooner. Performed at [facility], [place] [address] [place], [place] [address]  Culture, sputum-assessment     Status: None   Collection Time: 08/05/19  8:33 PM   Specimen: Sputum  Result Value Ref Range Status   Specimen Description SPUTUM  Final   Special Requests NONE  Final   Sputum evaluation   Final    THIS SPECIMEN IS ACCEPTABLE FOR SPUTUM CULTURE Performed at [facility], [address], [place], [place] [address]    Report Status 08/05/2019 FINAL  Final  Culture, respiratory     Status: None (Preliminary result)   Collection Time: 08/05/19  8:33 PM   Specimen: SPU  Result Value Ref Range Status   Specimen Description   Final    SPUTUM Performed at [facility], [address] [place], [place] [address]     Special Requests   Final    NONE Reflexed from [id number] Performed at [facility], [address] [place], [place] [address]    Gram Stain   Final    MODERATE WBC PRESENT, PREDOMINANTLY PMN MODERATE GRAM POSITIVE COCCI IN CLUSTERS IN CHAINS FEW GRAM NEGATIVE RODS FEW GRAM POSITIVE RODS FEW BUDDING YEAST SEEN    Culture   Final    CULTURE REINCUBATED FOR BETTER GROWTH Performed at [facility], [place] [address] [place], [place] [address]    Report Status PENDING  Incomplete  MRSA PCR Screening     Status: None   Collection Time: 08/07/19  2:00 AM   Specimen: Nasopharyngeal  Result Value Ref Range Status   MRSA by PCR NEGATIVE NEGATIVE Final    Comment:        The GeneXpert MRSA Assay (FDA approved for NASAL specimens only), is one component of a comprehensive MRSA colonization surveillance program. It is not intended to diagnose MRSA infection nor to guide or monitor treatment for MRSA infections. Performed at [facility], [address] [place], [place] [address]           IMAGING    DG Chest 1 View  Result Date: 08/06/2019 CLINICAL DATA:  Shortness of breath EXAM: CHEST  1 VIEW COMPARISON:  08/05/2019 FINDINGS: Cardiac shadow is stable. Previously seen right upper lobe density has increased slightly in the interval consistent with developing infiltrate. New left basilar atelectasis and small left effusion is noted. No acute bony abnormality is noted. IMPRESSION: Increase in left basilar atelectasis and small effusion. Patchy increase in density in the right upper lobe consistent with developing infiltrate. Electronically Signed   By: Inez Catalina M.D.   On: 08/06/2019 22:41   DG Abd Portable 2V  Result Date: 08/07/2019 CLINICAL DATA:  Nausea and vomiting. EXAM: PORTABLE ABDOMEN - 2 VIEW COMPARISON:  Single-view of the chest 08/06/2019. FINDINGS: Feeding tube is in place. Bowel gas pattern is nonobstructive. No free intraperitoneal air. There  is a small left pleural effusion and left basilar airspace disease. IMPRESSION: Negative abdomen.  Feeding tube noted. Left pleural effusion and basilar airspace disease as seen on chest film yesterday. Electronically Signed   By: Inge Rise M.D.   On: 08/07/2019 12:46       ASSESSMENT AND PLAN SYNOPSIS  73 yo white male with h/o throat cancer s/p PEG tube with aspiration pneumonia and pneumonitis  1.oxygen as needed keep 02 sats >88% 2.continue iV abx 3.BD therapy 4.hold TF's 5.continue IV steroids 6.continue incentive spirometry and flutter valve 7.consider Gi consult/Dietary consultation    Corrin Parker, M.D.  [facility]  Medical Director [place]  Medical Director [place] Department

## 2019-08-07 NOTE — Care Plan (Signed)
Mark Andrews is a 73 y.o. Male with a PMH notable for throat cancer with dysphagia requiring a feeding tube.  He was admitted to Med-Surg unit with Aspiration PNA. Pt was transferred to [facility] unit early this morning 08/07/19 by Hospitalist as pt had vomiting episode on the floor following his tube feedings, with subsequent aspiration.  He became tachycardic, short of breath, and hypoxic with increasing FiO2 requirements.  Follow up CXR shows worsening of the RUL density concerning for developing infiltrate. ABG with hypoxia, otherwise normal.  He was given a dose of solu-medrol and he remains on Unasyn and Azithromycin for aspiration coverage.  Upon arrival to ICU he was transitioned to HFNC.  He now appears more comfortable, RR mid 20's, and hypoxia has resolved with O2 sats 97%.    PCCM is not officially consulted, but is available for any critical needs, and should he decompensate further requiring intubation.      Darel Hong, AGACNP-BC [facility] Pager: [phone]

## 2019-08-07 NOTE — Progress Notes (Signed)
Stark Klein, NP at bedside to assess patient.  He continues to be SOB, 92% on 4L O2. Stat CXR done. Verbal orders to hold all scheduled tube feeds and tube medications.  Clarise Cruz, BSN

## 2019-08-07 NOTE — Progress Notes (Signed)
Patient ID: Husayn Cappello, male   DOB: 07-15-1946, 73 y.o.   MRN: [id number] [facility] PROGRESS NOTE  Deverick Warchol [id number] DOB: 06/11/1946 DOA: 08/05/2019 PCP: System, Pcp Not In  HPI/Subjective: The patient had a rough night.  Had another nausea vomiting and aspiration episode and needed to be transferred to the ICU and placed on high flow nasal cannula.  Patient with coughing and shortness of breath.  Nausea vomiting.  No abdominal pain.  Objective: Vitals:   08/07/19 1400 08/07/19 1430  BP: 126/77   Pulse: 80 84  Resp: 20 (!) 23  Temp:    SpO2: 97% 98%    Intake/Output Summary (Last 24 hours) at 08/07/2019 1635 Last data filed at 08/07/2019 1400 Gross per 24 hour  Intake 2840.36 ml  Output 1625 ml  Net 1215.36 ml   Filed Weights   08/05/19 1431 08/07/19 0300  Weight: 68.9 kg 69.9 kg    ROS: Review of Systems  Constitutional: Negative for chills and fever.  Eyes: Negative for blurred vision.  Respiratory: Positive for cough and shortness of breath.   Cardiovascular: Negative for chest pain.  Gastrointestinal: Positive for nausea and vomiting. Negative for abdominal pain, constipation and diarrhea.  Genitourinary: Negative for dysuria.  Musculoskeletal: Negative for joint pain.  Neurological: Negative for dizziness and headaches.   Exam: Physical Exam  Constitutional: He is oriented to person, place, and time.  HENT:  Nose: No mucosal edema.  Mouth/Throat: No oropharyngeal exudate or posterior oropharyngeal edema.  Eyes: Pupils are equal, round, and reactive to light. Conjunctivae, EOM and lids are normal.  Neck: No JVD present. Carotid bruit is not present. No thyroid mass and no thyromegaly present.  Cardiovascular: S1 normal and S2 normal. Exam reveals no gallop.  No murmur heard. Pulses:      Dorsalis pedis pulses are 2+ on the right side and 2+ on the left side.  Respiratory: No respiratory distress. He has decreased breath sounds in the right  lower field and the left lower field. He has no wheezes. He has no rhonchi. He has no rales.  GI: Soft. Bowel sounds are normal. There is no abdominal tenderness.  Musculoskeletal:     Cervical back: No edema.     Right ankle: No swelling.     Left ankle: No swelling.  Lymphadenopathy:    He has no cervical adenopathy.  Neurological: He is alert and oriented to person, place, and time. No cranial nerve deficit.  Skin: Skin is warm. No rash noted. Nails show no clubbing.  Psychiatric: He has a normal mood and affect.      Data Reviewed: Basic Metabolic Panel: Recent Labs  Lab 08/05/19 1437 08/06/19 0620  NA 128* 130*  K 4.6 4.4  CL 88* 100  CO2 28 27  GLUCOSE 108* 101*  BUN 18 13  CREATININE 0.94 0.60*  CALCIUM 8.8* 8.6*   Liver Function Tests: Recent Labs  Lab 08/06/19 0620  AST 24  ALT 31  ALKPHOS 143*  BILITOT 0.7  PROT 7.1  ALBUMIN 2.8*   CBC: Recent Labs  Lab 08/05/19 1437 08/06/19 0620  WBC 11.9* 10.3  NEUTROABS 9.1*  --   HGB 13.8 12.4*  HCT 41.3 35.9*  MCV 90.2 87.1  PLT 361 285   BNP (last 3 results) Recent Labs    09/07/18 2258  BNP 57.0      Recent Results (from the past 240 hour(s))  Urine culture     Status: None (Preliminary result)  Collection Time: 08/05/19  6:36 PM   Specimen: In/Out Cath Urine  Result Value Ref Range Status   Specimen Description   Final    IN/OUT CATH URINE Performed at [facility], [address], [place], [place] [address]    Special Requests   Final    NONE Performed at [facility], [address] [place], [place] [address]    Culture   Final    CULTURE REINCUBATED FOR BETTER GROWTH Performed at [facility], [place] [address], [place], [place] [address]    Report Status PENDING  Incomplete  Blood Culture (routine x 2)     Status: None (Preliminary result)   Collection Time: 08/05/19  6:47 PM   Specimen: BLOOD  Result Value Ref Range Status   Specimen Description BLOOD  RIGHT ANTECUBITAL  Final   Special Requests   Final    BOTTLES DRAWN AEROBIC AND ANAEROBIC Blood Culture adequate volume   Culture   Final    NO GROWTH 2 DAYS Performed at [facility], [address] [place], [place] [address]    Report Status PENDING  Incomplete  Blood Culture (routine x 2)     Status: None (Preliminary result)   Collection Time: 08/05/19  6:47 PM   Specimen: BLOOD  Result Value Ref Range Status   Specimen Description BLOOD BLOOD LEFT FOREARM  Final   Special Requests   Final    BOTTLES DRAWN AEROBIC AND ANAEROBIC Blood Culture adequate volume   Culture   Final    NO GROWTH 2 DAYS Performed at [facility], [address] [place], [place] [address]    Report Status PENDING  Incomplete  SARS CORONAVIRUS 2 (TAT 6-24 HRS) Nasopharyngeal Nasopharyngeal Swab     Status: None   Collection Time: 08/05/19  8:33 PM   Specimen: Nasopharyngeal Swab  Result Value Ref Range Status   SARS Coronavirus 2 NEGATIVE NEGATIVE Final    Comment: (NOTE) SARS-CoV-2 target nucleic acids are NOT DETECTED. The SARS-CoV-2 RNA is generally detectable in upper and lower respiratory specimens during the acute phase of infection. Negative results do not preclude SARS-CoV-2 infection, do not rule out co-infections with other pathogens, and should not be used as the sole basis for treatment or other patient management decisions. Negative results must be combined with clinical observations, patient history, and epidemiological information. The expected result is Negative. Fact Sheet for Patients: SugarRoll.be Fact Sheet for Healthcare Providers: https://www.woods-mathews.com/ This test is not yet approved or cleared by the [place] FDA and  has been authorized for detection and/or diagnosis of SARS-CoV-2 by FDA under an Emergency Use Authorization (EUA). This EUA will remain  in effect (meaning this test can be used) for the  duration of the COVID-19 declaration under Section 56 4(b)(1) of the Act, 21 U.S.C. section 360bbb-3(b)(1), unless the authorization is terminated or revoked sooner. Performed at [facility], [place] [address], [place], [place] [address]   Culture, sputum-assessment     Status: None   Collection Time: 08/05/19  8:33 PM   Specimen: Sputum  Result Value Ref Range Status   Specimen Description SPUTUM  Final   Special Requests NONE  Final   Sputum evaluation   Final    THIS SPECIMEN IS ACCEPTABLE FOR SPUTUM CULTURE Performed at [facility], [address], [place], [place] [address]    Report Status 08/05/2019 FINAL  Final  Culture, respiratory     Status: None (Preliminary result)  Collection Time: 08/05/19  8:33 PM   Specimen: SPU  Result Value Ref Range Status   Specimen Description   Final    SPUTUM Performed at [facility], [address] [place], [place] [address]    Special Requests   Final    NONE Reflexed from [id number] Performed at [facility], [address] [place], [place] [address]    Gram Stain   Final    MODERATE WBC PRESENT, PREDOMINANTLY PMN MODERATE GRAM POSITIVE COCCI IN CLUSTERS IN CHAINS FEW GRAM NEGATIVE RODS FEW GRAM POSITIVE RODS FEW BUDDING YEAST SEEN    Culture   Final    CULTURE REINCUBATED FOR BETTER GROWTH Performed at [facility], [place] [address], [place], [place] [address]    Report Status PENDING  Incomplete  MRSA PCR Screening     Status: None   Collection Time: 08/07/19  2:00 AM   Specimen: Nasopharyngeal  Result Value Ref Range Status   MRSA by PCR NEGATIVE NEGATIVE Final    Comment:        The GeneXpert MRSA Assay (FDA approved for NASAL specimens only), is one component of a comprehensive MRSA colonization surveillance program. It is not intended to diagnose MRSA infection nor to guide or monitor treatment for MRSA infections. Performed at [facility], [address]  [place],  [address]      Studies: DG Chest 1 View  Result Date: 08/06/2019 CLINICAL DATA:  Shortness of breath EXAM: CHEST  1 VIEW COMPARISON:  08/05/2019 FINDINGS: Cardiac shadow is stable. Previously seen right upper lobe density has increased slightly in the interval consistent with developing infiltrate. New left basilar atelectasis and small left effusion is noted. No acute bony abnormality is noted. IMPRESSION: Increase in left basilar atelectasis and small effusion. Patchy increase in density in the right upper lobe consistent with developing infiltrate. Electronically Signed   By: Inez Catalina M.D.   On: 08/06/2019 22:41   CT ANGIO CHEST PE W OR WO CONTRAST  Result Date: 08/07/2019 CLINICAL DATA:  Nausea, vomiting. Shortness of breath. History of throat cancer. EXAM: CT ANGIOGRAPHY CHEST CT ABDOMEN AND PELVIS WITH CONTRAST TECHNIQUE: Multidetector CT imaging of the chest was performed using the standard protocol during bolus administration of intravenous contrast. Multiplanar CT image reconstructions and MIPs were obtained to evaluate the vascular anatomy. Multidetector CT imaging of the abdomen and pelvis was performed using the standard protocol during bolus administration of intravenous contrast. CONTRAST:  31mL OMNIPAQUE IOHEXOL 350 MG/ML SOLN COMPARISON:  September 08, 2018. FINDINGS: CTA CHEST FINDINGS Cardiovascular: Satisfactory opacification of the pulmonary arteries to the segmental level. No evidence of pulmonary embolism. Normal heart size. No pericardial effusion. Mediastinum/Nodes: Thyroid gland and esophagus are unremarkable. 2.1 cm right hilar lymph node is noted. 1.6 cm precarinal lymph node is noted. 1.8 cm subcarinal lymph node is noted. Lungs/Pleura: No pneumothorax is noted. Minimal right pleural effusion is noted with adjacent subsegmental atelectasis. Left lower lobe atelectasis or pneumonia is noted. Musculoskeletal: No chest wall abnormality. No acute or significant  osseous findings. Review of the MIP images confirms the above findings. CT ABDOMEN and PELVIS FINDINGS Hepatobiliary: Multiple large gallstones are noted. No biliary dilatation is noted. Liver is unremarkable. Pancreas: Unremarkable. No pancreatic ductal dilatation or surrounding inflammatory changes. Spleen: Normal in size without focal abnormality. Adrenals/Urinary Tract: Adrenal glands appear normal. Right renal cysts are noted. No hydronephrosis or renal obstruction is noted. No renal or ureteral calculi are noted. Urinary bladder is  unremarkable. Stomach/Bowel: Gastrostomy tube is well positioned. There is no evidence of bowel obstruction or inflammation. The appendix is unremarkable. Vascular/Lymphatic: Aortic atherosclerosis. No enlarged abdominal or pelvic lymph nodes. Reproductive: Mild prostatic enlargement is noted. Other: No abdominal wall hernia or abnormality. No abdominopelvic ascites. Musculoskeletal: No acute or significant osseous findings. Review of the MIP images confirms the above findings. IMPRESSION: No definite evidence of pulmonary embolus. Mediastinal adenopathy is again noted concerning for metastatic disease. Large left lower lobe airspace opacity is noted concerning for pneumonia or atelectasis. Minimal right pleural effusion is noted with adjacent subsegmental atelectasis. Cholelithiasis without inflammation. Gastrostomy tube in grossly good position. Mild prostatic enlargement. Aortic Atherosclerosis (ICD10-I70.0). Electronically Signed   By: Marijo Conception M.D.   On: 08/07/2019 14:00   CT ABDOMEN PELVIS W CONTRAST  Result Date: 08/07/2019 CLINICAL DATA:  Nausea, vomiting. Shortness of breath. History of throat cancer. EXAM: CT ANGIOGRAPHY CHEST CT ABDOMEN AND PELVIS WITH CONTRAST TECHNIQUE: Multidetector CT imaging of the chest was performed using the standard protocol during bolus administration of intravenous contrast. Multiplanar CT image reconstructions and MIPs were  obtained to evaluate the vascular anatomy. Multidetector CT imaging of the abdomen and pelvis was performed using the standard protocol during bolus administration of intravenous contrast. CONTRAST:  49mL OMNIPAQUE IOHEXOL 350 MG/ML SOLN COMPARISON:  September 08, 2018. FINDINGS: CTA CHEST FINDINGS Cardiovascular: Satisfactory opacification of the pulmonary arteries to the segmental level. No evidence of pulmonary embolism. Normal heart size. No pericardial effusion. Mediastinum/Nodes: Thyroid gland and esophagus are unremarkable. 2.1 cm right hilar lymph node is noted. 1.6 cm precarinal lymph node is noted. 1.8 cm subcarinal lymph node is noted. Lungs/Pleura: No pneumothorax is noted. Minimal right pleural effusion is noted with adjacent subsegmental atelectasis. Left lower lobe atelectasis or pneumonia is noted. Musculoskeletal: No chest wall abnormality. No acute or significant osseous findings. Review of the MIP images confirms the above findings. CT ABDOMEN and PELVIS FINDINGS Hepatobiliary: Multiple large gallstones are noted. No biliary dilatation is noted. Liver is unremarkable. Pancreas: Unremarkable. No pancreatic ductal dilatation or surrounding inflammatory changes. Spleen: Normal in size without focal abnormality. Adrenals/Urinary Tract: Adrenal glands appear normal. Right renal cysts are noted. No hydronephrosis or renal obstruction is noted. No renal or ureteral calculi are noted. Urinary bladder is unremarkable. Stomach/Bowel: Gastrostomy tube is well positioned. There is no evidence of bowel obstruction or inflammation. The appendix is unremarkable. Vascular/Lymphatic: Aortic atherosclerosis. No enlarged abdominal or pelvic lymph nodes. Reproductive: Mild prostatic enlargement is noted. Other: No abdominal wall hernia or abnormality. No abdominopelvic ascites. Musculoskeletal: No acute or significant osseous findings. Review of the MIP images confirms the above findings. IMPRESSION: No definite  evidence of pulmonary embolus. Mediastinal adenopathy is again noted concerning for metastatic disease. Large left lower lobe airspace opacity is noted concerning for pneumonia or atelectasis. Minimal right pleural effusion is noted with adjacent subsegmental atelectasis. Cholelithiasis without inflammation. Gastrostomy tube in grossly good position. Mild prostatic enlargement. Aortic Atherosclerosis (ICD10-I70.0). Electronically Signed   By: Marijo Conception M.D.   On: 08/07/2019 14:00   DG Abd Portable 2V  Result Date: 08/07/2019 CLINICAL DATA:  Nausea and vomiting. EXAM: PORTABLE ABDOMEN - 2 VIEW COMPARISON:  Single-view of the chest 08/06/2019. FINDINGS: Feeding tube is in place. Bowel gas pattern is nonobstructive. No free intraperitoneal air. There is a small left pleural effusion and left basilar airspace disease. IMPRESSION: Negative abdomen.  Feeding tube noted. Left pleural effusion and basilar airspace disease as seen on chest film yesterday.  Electronically Signed   By: Inge Rise M.D.   On: 08/07/2019 12:46   US Abdomen Limited RUQ  Result Date: 08/06/2019 CLINICAL DATA:  Nausea, vomiting and shortness of breath for 1 week EXAM: ULTRASOUND ABDOMEN LIMITED RIGHT UPPER QUADRANT COMPARISON:  Chest angiography 09/08/2018 FINDINGS: Gallbladder: The gallbladder contains multiple calcified shadowing gallstones, largest measuring up to 1.7 cm in maximal diameter. No gallbladder wall thickening or pericholecystic fluid is seen. Sonographic Percell Miller sign is reportedly negative Common bile duct: Diameter: 2.2 mm, nondilated Liver: There is a coarsened, heterogeneous echotexture of the liver with some mild surface nodularity. No focal liver lesions are seen. Portal vein is patent on color Doppler imaging with normal direction of blood flow towards the liver. Other: None. IMPRESSION: Cholelithiasis without sonographic evidence of acute cholecystitis. Heterogeneous, coarsened and nodular liver, may suggest  underlying intrinsic liver disease such as cirrhosis. Electronically Signed   By: Lovena Le M.D.   On: 08/06/2019 13:44    Scheduled Meds: . budesonide (PULMICORT) nebulizer solution  0.5 mg Nebulization BID  . Chlorhexidine Gluconate Cloth  6 each Topical Daily  . enoxaparin (LOVENOX) injection  40 mg Subcutaneous Q24H  . free water  60 mL Per Tube BID  . levothyroxine  75 mcg Per Tube Q0600  . mouth rinse  15 mL Mouth Rinse BID  . metoCLOPramide (REGLAN) injection  5 mg Intravenous Q6H  . pantoprazole (PROTONIX) IV  40 mg Intravenous Q24H  . sodium fluoride  1 application dental Daily   Continuous Infusions: . sodium chloride Stopped (08/07/19 1352)  . ampicillin-sulbactam (UNASYN) IV 200 mL/hr at 08/07/19 1400  . azithromycin Stopped (08/06/19 2149)  . [START ON 08/08/2019] fluconazole (DIFLUCAN) IV      Assessment/Plan:  1. Acute hypoxic respiratory failure.  Patient was on high flow nasal cannula 100% oxygen earlier this morning when I saw him.  Now it looks like he is tapered down to 50% FiO2.  Nebulizer treatments. 2. Aspiration pneumonia from tube feeds.  Continue Unasyn and Zithromax. 3. Patient unable to tolerate tube feedings, with nausea vomiting.  Start Reglan.  GI consultation for tomorrow.  Holding tube feedings at this time.  CT abdomen pelvis does not show any blockage or anything that would cause persistent nausea vomiting. 4. Hyponatremia.  Gentle IV fluid hydration. 5. Yeast seen on tracheal aspirate.  Started Diflucan. 6. Hypothyroidism on levothyroxine 7. GERD on Protonix 8. History of throat cancer with dysphagia. 9. Mediastinal adenopathy.  May end up needing a PET CT scan as outpatient.  Code Status:     Code Status Orders  (From admission, onward)         Start     Ordered   08/05/19 2010  Full code  Continuous     08/05/19 2019        Code Status History    Date Active Date Inactive Code Status Order ID Comments User Context   09/08/2018  0404 09/09/2018 1913 Full Code [id number]  Harrie Foreman, MD Inpatient   Advance Care Planning Activity    Advance Directive Documentation     Most Recent Value  Type of Advance Directive  Healthcare Power of Attorney, Living will  Pre-existing out of facility DNR order (yellow form or pink MOST form)  --  "MOST" Form in Place?  --     Family Communication: Spoke with the patient's wife this morning. Disposition Plan: To be determined  Antibiotics:  Unasyn  Zithromax  Time spent: 28 minutes.  Case discussed with critical care specialist.  Gastroenterology to see tomorrow.  Jakin  [facility] [employer]

## 2019-08-08 DIAGNOSIS — E43 Unspecified severe protein-calorie malnutrition: Secondary | ICD-10-CM | POA: Insufficient documentation

## 2019-08-08 LAB — CBC
HCT: 36.9 % — ABNORMAL LOW (ref 39.0–52.0)
Hemoglobin: 12.3 g/dL — ABNORMAL LOW (ref 13.0–17.0)
MCH: 30.1 pg (ref 26.0–34.0)
MCHC: 33.3 g/dL (ref 30.0–36.0)
MCV: 90.2 fL (ref 80.0–100.0)
Platelets: 365 10*3/uL (ref 150–400)
RBC: 4.09 MIL/uL — ABNORMAL LOW (ref 4.22–5.81)
RDW: 12.4 % (ref 11.5–15.5)
WBC: 12.2 10*3/uL — ABNORMAL HIGH (ref 4.0–10.5)
nRBC: 0 % (ref 0.0–0.2)

## 2019-08-08 LAB — URINE CULTURE: Culture: 100 — AB

## 2019-08-08 LAB — HEPATITIS B SURFACE ANTIBODY, QUANTITATIVE: Hep B S AB Quant (Post): 3.6 m[IU]/mL — ABNORMAL LOW (ref >9.9)

## 2019-08-08 LAB — BASIC METABOLIC PANEL
Anion gap: 11 (ref 5–15)
BUN: 17 mg/dL (ref 8–23)
CO2: 25 mmol/L (ref 22–32)
Calcium: 8.9 mg/dL (ref 8.9–10.3)
Chloride: 99 mmol/L (ref 98–111)
Creatinine, Ser: 0.8 mg/dL (ref 0.61–1.24)
GFR calc Af Amer: >60 mL/min (ref >60)
GFR calc non Af Amer: >60 mL/min (ref >60)
Glucose, Bld: 94 mg/dL (ref 70–99)
Potassium: 4 mmol/L (ref 3.5–5.1)
Sodium: 135 mmol/L (ref 135–145)

## 2019-08-08 MED ORDER — FREE WATER
60.0000 mL | Freq: Two times a day (BID) | Status: DC
  Administered 2019-08-09 (×2): 60 mL

## 2019-08-08 MED ORDER — FREE WATER
120.0000 mL | Freq: Four times a day (QID) | Status: DC
  Administered 2019-08-08: 20:00:00 120 mL
  Administered 2019-08-08: 60 mL
  Administered 2019-08-09 (×4): 120 mL

## 2019-08-08 MED ORDER — OSMOLITE 1.5 CAL PO LIQD
120.0000 mL | Freq: Four times a day (QID) | ORAL | Status: DC
  Administered 2019-08-08 – 2019-08-09 (×5): 120 mL

## 2019-08-08 NOTE — Progress Notes (Signed)
transport pt to room 218 via w/c. Pt alert oriented and vss

## 2019-08-08 NOTE — Progress Notes (Addendum)
Nutrition Follow Up Note   DOCUMENTATION CODES:   Severe malnutrition in context of chronic illness  INTERVENTION:   Change to Osmolite 1.5 Cal- 1 can 6 times daily per G-tube. Will start with 1/2 can 4 times daily and increase as tolerated.   Regimen provides 2130 kcal, 90 grams of protein, 0 grams of fiber, and 1086 mL H2O daily.  Flush tube with 60 mL of water before and after each bolus tube feeding. Also provide an additional 60 mL of water twice daily between feeds. This provides a total of 1926 mL H2O daily including water in tube feeding regimen, and is approximately the same amount of water patient receives at home from his tube feed regimen.  Goal TF regimen meets 100% RDIs for vitamins/minerals.  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  GOAL:   Patient will meet greater than or equal to 90% of their needs  -progressing   MONITOR:   Labs, Weight trends, TF tolerance, I & O's  ASSESSMENT:   73 year old male with PMHx of GERD, hypothyroidism, throat cancer s/p tonsillectomy, dysphagia with recurrent aspiration s/p G-tube placement admitted with aspiration PNA, mild sepsis. Mediastinal adenopathy concerning for metastatic disease  Pt has continued to have nausea and vomiting with tube feeds; pt currently with aspiration PNA. Met with pt in room today. Pt reports that he feeds himself via gravity at home but reports that sometimes he feels as though he is so full that the tube feed formula will not even go in. Pt has been doing 2 cans, three times daily. Pt was switched to 1 can, six times daily in hospital but continues to vomit and not  tolerate. Pt reports continued nausea today but no vomiting. MD would like to restart tube feeds at 1/2 can, 4 times daily and advance slowly. Pt frustrated and reports that he would just rather not have any feeds at all because he  does not want to vomit again. Discussed with pt plans to switch tube feeds to formula without fiber. Pt has been receiving 30g of fiber per day with his current tube feeds; will change to Osmolite which does not contain any fiber.  Pt prefers to continue with bolus feeds versus continuous  Feeds if possible. If patient is unable to tolerate this formula would recommend trying continuous feeds. As an alternative resort, could consider J-tube placement to provide post pyloric feeds as G-tube does not prevent aspiration.   Enteral Access: G-tube placed approximately 6 months ago according to patient  Medications reviewed and include: lovenox, synthroid, reglan, protonix, NaCl '@50ml' /hr, unasyn, azithromycin, diflucan  Labs reviewed: K 4.0 wnl Wbc- 12.2(H)  Nutrition-Focused physical exam completed. Findings are moderate to severe fat depletions, severe muscle depletions and no edema.   Diet Order:   Diet Order            Diet NPO time specified  Diet effective now             EDUCATION NEEDS:   No education needs have been identified at this time  Skin:  Skin Assessment: Reviewed RN Assessment(not yet completed)  Last BM:  12/8  Height:   Ht Readings from Last 1 Encounters:  08/07/19 '5\' 11"'  (1.803 m)   Weight:   Wt Readings from Last 1 Encounters:  08/07/19 69.9 kg   Ideal Body Weight:  78.2 kg  BMI:  Body mass index  is 21.49 kg/m.  Estimated Nutritional Needs:   Kcal:  1905-2200 (MSJ x 1.3-1.5)  Protein:  83-103 grams (1.2-1.5 grams/kg)  Fluid:  1.7-2 L/day (25-30 mL/kg)  Koleen Distance MS, RD, LDN Pager #- [phone] Office#- [phone] After Hours Pager: [phone]

## 2019-08-08 NOTE — Progress Notes (Signed)
Patient ID: Mark Andrews, male   DOB: May 06, 1946, 73 y.o.   MRN: [id number] [facility] PROGRESS NOTE  Mark Andrews [id number] DOB: [birth year] DOA: 08/05/2019 PCP: System, Pcp Not In  HPI/Subjective: The patient is feeling better today than yesterday.  Still with some cough and shortness of breath.  Still with some nausea but no vomiting.  Objective: Vitals:   08/08/19 1000 08/08/19 1018  BP: (!) 149/125 (!) 139/94  Pulse: 83 77  Resp: 20 (!) 27  Temp:    SpO2: 94% 97%    Intake/Output Summary (Last 24 hours) at 08/08/2019 1254 Last data filed at 08/08/2019 1000 Gross per 24 hour  Intake 1864.81 ml  Output 1700 ml  Net 164.81 ml   Filed Weights   08/05/19 1431 08/07/19 0300  Weight: 68.9 kg 69.9 kg    ROS: Review of Systems  Constitutional: Negative for chills and fever.  Eyes: Negative for blurred vision.  Respiratory: Positive for cough and shortness of breath.   Cardiovascular: Negative for chest pain.  Gastrointestinal: Positive for nausea. Negative for abdominal pain, constipation, diarrhea and vomiting.  Genitourinary: Negative for dysuria.  Musculoskeletal: Negative for joint pain.  Neurological: Negative for dizziness and headaches.   Exam: Physical Exam  Constitutional: He is oriented to person, place, and time.  HENT:  Nose: No mucosal edema.  Mouth/Throat: No oropharyngeal exudate or posterior oropharyngeal edema.  Eyes: Pupils are equal, round, and reactive to light. Conjunctivae, EOM and lids are normal.  Neck: No JVD present. Carotid bruit is not present. No thyroid mass and no thyromegaly present.  Cardiovascular: S1 normal and S2 normal. Exam reveals no gallop.  No murmur heard. Pulses:      Dorsalis pedis pulses are 2+ on the right side and 2+ on the left side.  Respiratory: No respiratory distress. He has decreased breath sounds in the right lower field and the left lower field. He has no wheezes. He has rhonchi in the right lower field  and the left lower field. He has no rales.  GI: Soft. Bowel sounds are normal. There is no abdominal tenderness.  Musculoskeletal:     Cervical back: No edema.     Right ankle: No swelling.     Left ankle: No swelling.  Lymphadenopathy:    He has no cervical adenopathy.  Neurological: He is alert and oriented to person, place, and time. No cranial nerve deficit.  Skin: Skin is warm. No rash noted. Nails show no clubbing.  Psychiatric: He has a normal mood and affect.      Data Reviewed: Basic Metabolic Panel: Recent Labs  Lab 08/05/19 1437 08/06/19 0620 08/08/19 0659  NA 128* 130* 135  K 4.6 4.4 4.0  CL 88* 100 99  CO2 28 27 25   GLUCOSE 108* 101* 94  BUN 18 13 17   CREATININE 0.94 0.60* 0.80  CALCIUM 8.8* 8.6* 8.9   Liver Function Tests: Recent Labs  Lab 08/06/19 0620  AST 24  ALT 31  ALKPHOS 143*  BILITOT 0.7  PROT 7.1  ALBUMIN 2.8*   CBC: Recent Labs  Lab 08/05/19 1437 08/06/19 0620 08/08/19 0659  WBC 11.9* 10.3 12.2*  NEUTROABS 9.1*  --   --   HGB 13.8 12.4* 12.3*  HCT 41.3 35.9* 36.9*  MCV 90.2 87.1 90.2  PLT 361 285 365   BNP (last 3 results) Recent Labs    09/07/18 2258  BNP 57.0      Recent Results (from the past 240 hour(s))  Urine  culture     Status: Abnormal   Collection Time: 08/05/19  6:36 PM   Specimen: In/Out Cath Urine  Result Value Ref Range Status   Specimen Description   Final    IN/OUT CATH URINE Performed at [facility], [address] [place], [place] [address]    Special Requests   Final    NONE Performed at [facility], [place], [place] [address]    Culture 100 COLONIES/mL STAPHYLOCOCCUS HAEMOLYTICUS (A)  Final   Report Status 08/08/2019 FINAL  Final   Organism ID, Bacteria STAPHYLOCOCCUS HAEMOLYTICUS (A)  Final      Susceptibility   Staphylococcus haemolyticus - MIC*    CIPROFLOXACIN <=0.5 SENSITIVE Sensitive     GENTAMICIN <=0.5 SENSITIVE Sensitive     NITROFURANTOIN <=16  SENSITIVE Sensitive     OXACILLIN >=4 RESISTANT Resistant     TETRACYCLINE >=16 RESISTANT Resistant     VANCOMYCIN <=0.5 SENSITIVE Sensitive     TRIMETH/SULFA <=10 SENSITIVE Sensitive     CLINDAMYCIN <=0.25 SENSITIVE Sensitive     RIFAMPIN <=0.5 SENSITIVE Sensitive     Inducible Clindamycin NEGATIVE Sensitive     * 100 COLONIES/mL STAPHYLOCOCCUS HAEMOLYTICUS  Blood Culture (routine x 2)     Status: None (Preliminary result)   Collection Time: 08/05/19  6:47 PM   Specimen: BLOOD  Result Value Ref Range Status   Specimen Description BLOOD RIGHT ANTECUBITAL  Final   Special Requests   Final    BOTTLES DRAWN AEROBIC AND ANAEROBIC Blood Culture adequate volume   Culture   Final    NO GROWTH 3 DAYS Performed at [facility], [address] [place], [place] [address]    Report Status PENDING  Incomplete  Blood Culture (routine x 2)     Status: None (Preliminary result)   Collection Time: 08/05/19  6:47 PM   Specimen: BLOOD  Result Value Ref Range Status   Specimen Description BLOOD BLOOD LEFT FOREARM  Final   Special Requests   Final    BOTTLES DRAWN AEROBIC AND ANAEROBIC Blood Culture adequate volume   Culture   Final    NO GROWTH 3 DAYS Performed at [facility], [place], [place] [address]    Report Status PENDING  Incomplete  SARS CORONAVIRUS 2 (TAT 6-24 HRS) Nasopharyngeal Nasopharyngeal Swab     Status: None   Collection Time: 08/05/19  8:33 PM   Specimen: Nasopharyngeal Swab  Result Value Ref Range Status   SARS Coronavirus 2 NEGATIVE NEGATIVE Final    Comment: (NOTE) SARS-CoV-2 target nucleic acids are NOT DETECTED. The SARS-CoV-2 RNA is generally detectable in upper and lower respiratory specimens during the acute phase of infection. Negative results do not preclude SARS-CoV-2 infection, do not rule out co-infections with other pathogens, and should not be used as the sole basis for treatment or other patient management  decisions. Negative results must be combined with clinical observations, patient history, and epidemiological information. The expected result is Negative. Fact Sheet for Patients: SugarRoll.be Fact Sheet for Healthcare Providers: https://www.woods-mathews.com/ This test is not yet approved or cleared by the [place] FDA and  has been authorized for detection and/or diagnosis of SARS-CoV-2 by FDA under an Emergency Use Authorization (EUA). This EUA will remain  in effect (meaning this test can be used) for the duration of the COVID-19 declaration under Section 56 4(b)(1) of the Act, 21 U.S.C. section 360bbb-3(b)(1), unless the authorization is terminated or revoked sooner. Performed at  [facility], [place] [address], [place], [place] [address]   Culture, sputum-assessment     Status: None   Collection Time: 08/05/19  8:33 PM   Specimen: Sputum  Result Value Ref Range Status   Specimen Description SPUTUM  Final   Special Requests NONE  Final   Sputum evaluation   Final    THIS SPECIMEN IS ACCEPTABLE FOR SPUTUM CULTURE Performed at [facility], [address] [place], [place] [address]    Report Status 08/05/2019 FINAL  Final  Culture, respiratory     Status: None (Preliminary result)   Collection Time: 08/05/19  8:33 PM   Specimen: SPU  Result Value Ref Range Status   Specimen Description   Final    SPUTUM Performed at [facility], [address], [place], [place] [address]    Special Requests   Final    NONE Reflexed from [phone] Performed at [facility], [address] [place], [place] [address]    Gram Stain   Final    MODERATE WBC PRESENT, PREDOMINANTLY PMN MODERATE GRAM POSITIVE COCCI IN CLUSTERS IN CHAINS FEW GRAM NEGATIVE RODS FEW GRAM POSITIVE RODS FEW BUDDING YEAST SEEN    Culture   Final    ABUNDANT GRAM NEGATIVE RODS CULTURE REINCUBATED FOR BETTER GROWTH Performed at [facility], [place] [address], [place], [place] [address]    Report Status PENDING  Incomplete  MRSA PCR Screening     Status: None   Collection Time: 08/07/19  2:00 AM   Specimen: Nasopharyngeal  Result Value Ref Range Status   MRSA by PCR NEGATIVE NEGATIVE Final    Comment:        The GeneXpert MRSA Assay (FDA approved for NASAL specimens only), is one component of a comprehensive MRSA colonization surveillance program. It is not intended to diagnose MRSA infection nor to guide or monitor treatment for MRSA infections. Performed at [facility], [address] [place], [place] [address]      Studies: DG Chest 1 View  Result Date: 08/06/2019 CLINICAL DATA:  Shortness of breath EXAM: CHEST  1 VIEW COMPARISON:  08/05/2019 FINDINGS: Cardiac shadow is stable. Previously seen right upper lobe density has increased slightly in the interval consistent with developing infiltrate. New left basilar atelectasis and small left effusion is noted. No acute bony abnormality is noted. IMPRESSION: Increase in left basilar atelectasis and small effusion. Patchy increase in density in the right upper lobe consistent with developing infiltrate. Electronically Signed   By: Inez Catalina M.D.   On: 08/06/2019 22:41   CT ANGIO CHEST PE W OR WO CONTRAST  Result Date: 08/07/2019 CLINICAL DATA:  Nausea, vomiting. Shortness of breath. History of throat cancer. EXAM: CT ANGIOGRAPHY CHEST CT ABDOMEN AND PELVIS WITH CONTRAST TECHNIQUE: Multidetector CT imaging of the chest was performed using the standard protocol during bolus administration of intravenous contrast. Multiplanar CT image reconstructions and MIPs were obtained to evaluate the vascular anatomy. Multidetector CT imaging of the abdomen and pelvis was performed using the standard protocol during bolus administration of intravenous contrast. CONTRAST:  67mL OMNIPAQUE IOHEXOL 350 MG/ML SOLN COMPARISON:  September 08, 2018. FINDINGS: CTA CHEST  FINDINGS Cardiovascular: Satisfactory opacification of the pulmonary arteries to the segmental level. No evidence of pulmonary embolism. Normal heart size. No pericardial effusion. Mediastinum/Nodes: Thyroid gland and esophagus are unremarkable. 2.1 cm right hilar lymph node is noted. 1.6 cm precarinal lymph node is noted. 1.8 cm subcarinal lymph node is  noted. Lungs/Pleura: No pneumothorax is noted. Minimal right pleural effusion is noted with adjacent subsegmental atelectasis. Left lower lobe atelectasis or pneumonia is noted. Musculoskeletal: No chest wall abnormality. No acute or significant osseous findings. Review of the MIP images confirms the above findings. CT ABDOMEN and PELVIS FINDINGS Hepatobiliary: Multiple large gallstones are noted. No biliary dilatation is noted. Liver is unremarkable. Pancreas: Unremarkable. No pancreatic ductal dilatation or surrounding inflammatory changes. Spleen: Normal in size without focal abnormality. Adrenals/Urinary Tract: Adrenal glands appear normal. Right renal cysts are noted. No hydronephrosis or renal obstruction is noted. No renal or ureteral calculi are noted. Urinary bladder is unremarkable. Stomach/Bowel: Gastrostomy tube is well positioned. There is no evidence of bowel obstruction or inflammation. The appendix is unremarkable. Vascular/Lymphatic: Aortic atherosclerosis. No enlarged abdominal or pelvic lymph nodes. Reproductive: Mild prostatic enlargement is noted. Other: No abdominal wall hernia or abnormality. No abdominopelvic ascites. Musculoskeletal: No acute or significant osseous findings. Review of the MIP images confirms the above findings. IMPRESSION: No definite evidence of pulmonary embolus. Mediastinal adenopathy is again noted concerning for metastatic disease. Large left lower lobe airspace opacity is noted concerning for pneumonia or atelectasis. Minimal right pleural effusion is noted with adjacent subsegmental atelectasis. Cholelithiasis without  inflammation. Gastrostomy tube in grossly good position. Mild prostatic enlargement. Aortic Atherosclerosis (ICD10-I70.0). Electronically Signed   By: Marijo Conception M.D.   On: 08/07/2019 14:00   CT ABDOMEN PELVIS W CONTRAST  Result Date: 08/07/2019 CLINICAL DATA:  Nausea, vomiting. Shortness of breath. History of throat cancer. EXAM: CT ANGIOGRAPHY CHEST CT ABDOMEN AND PELVIS WITH CONTRAST TECHNIQUE: Multidetector CT imaging of the chest was performed using the standard protocol during bolus administration of intravenous contrast. Multiplanar CT image reconstructions and MIPs were obtained to evaluate the vascular anatomy. Multidetector CT imaging of the abdomen and pelvis was performed using the standard protocol during bolus administration of intravenous contrast. CONTRAST:  52mL OMNIPAQUE IOHEXOL 350 MG/ML SOLN COMPARISON:  September 08, 2018. FINDINGS: CTA CHEST FINDINGS Cardiovascular: Satisfactory opacification of the pulmonary arteries to the segmental level. No evidence of pulmonary embolism. Normal heart size. No pericardial effusion. Mediastinum/Nodes: Thyroid gland and esophagus are unremarkable. 2.1 cm right hilar lymph node is noted. 1.6 cm precarinal lymph node is noted. 1.8 cm subcarinal lymph node is noted. Lungs/Pleura: No pneumothorax is noted. Minimal right pleural effusion is noted with adjacent subsegmental atelectasis. Left lower lobe atelectasis or pneumonia is noted. Musculoskeletal: No chest wall abnormality. No acute or significant osseous findings. Review of the MIP images confirms the above findings. CT ABDOMEN and PELVIS FINDINGS Hepatobiliary: Multiple large gallstones are noted. No biliary dilatation is noted. Liver is unremarkable. Pancreas: Unremarkable. No pancreatic ductal dilatation or surrounding inflammatory changes. Spleen: Normal in size without focal abnormality. Adrenals/Urinary Tract: Adrenal glands appear normal. Right renal cysts are noted. No hydronephrosis or renal  obstruction is noted. No renal or ureteral calculi are noted. Urinary bladder is unremarkable. Stomach/Bowel: Gastrostomy tube is well positioned. There is no evidence of bowel obstruction or inflammation. The appendix is unremarkable. Vascular/Lymphatic: Aortic atherosclerosis. No enlarged abdominal or pelvic lymph nodes. Reproductive: Mild prostatic enlargement is noted. Other: No abdominal wall hernia or abnormality. No abdominopelvic ascites. Musculoskeletal: No acute or significant osseous findings. Review of the MIP images confirms the above findings. IMPRESSION: No definite evidence of pulmonary embolus. Mediastinal adenopathy is again noted concerning for metastatic disease. Large left lower lobe airspace opacity is noted concerning for pneumonia or atelectasis. Minimal right pleural effusion is noted with adjacent  subsegmental atelectasis. Cholelithiasis without inflammation. Gastrostomy tube in grossly good position. Mild prostatic enlargement. Aortic Atherosclerosis (ICD10-I70.0). Electronically Signed   By: Marijo Conception M.D.   On: 08/07/2019 14:00   DG Abd Portable 2V  Result Date: 08/07/2019 CLINICAL DATA:  Nausea and vomiting. EXAM: PORTABLE ABDOMEN - 2 VIEW COMPARISON:  Single-view of the chest 08/06/2019. FINDINGS: Feeding tube is in place. Bowel gas pattern is nonobstructive. No free intraperitoneal air. There is a small left pleural effusion and left basilar airspace disease. IMPRESSION: Negative abdomen.  Feeding tube noted. Left pleural effusion and basilar airspace disease as seen on chest film yesterday. Electronically Signed   By: Inge Rise M.D.   On: 08/07/2019 12:46   US Abdomen Limited RUQ  Result Date: 08/06/2019 CLINICAL DATA:  Nausea, vomiting and shortness of breath for 1 week EXAM: ULTRASOUND ABDOMEN LIMITED RIGHT UPPER QUADRANT COMPARISON:  Chest angiography 09/08/2018 FINDINGS: Gallbladder: The gallbladder contains multiple calcified shadowing gallstones, largest  measuring up to 1.7 cm in maximal diameter. No gallbladder wall thickening or pericholecystic fluid is seen. Sonographic Percell Miller sign is reportedly negative Common bile duct: Diameter: 2.2 mm, nondilated Liver: There is a coarsened, heterogeneous echotexture of the liver with some mild surface nodularity. No focal liver lesions are seen. Portal vein is patent on color Doppler imaging with normal direction of blood flow towards the liver. Other: None. IMPRESSION: Cholelithiasis without sonographic evidence of acute cholecystitis. Heterogeneous, coarsened and nodular liver, may suggest underlying intrinsic liver disease such as cirrhosis. Electronically Signed   By: Lovena Le M.D.   On: 08/06/2019 13:44    Scheduled Meds: . budesonide (PULMICORT) nebulizer solution  0.5 mg Nebulization BID  . Chlorhexidine Gluconate Cloth  6 each Topical Daily  . enoxaparin (LOVENOX) injection  40 mg Subcutaneous Q24H  . free water  60 mL Per Tube BID  . levothyroxine  75 mcg Per Tube Q0600  . mouth rinse  15 mL Mouth Rinse BID  . metoCLOPramide (REGLAN) injection  5 mg Intravenous Q6H  . pantoprazole (PROTONIX) IV  40 mg Intravenous Q24H  . sodium fluoride  1 application dental Daily   Continuous Infusions: . sodium chloride 50 mL/hr at 08/08/19 1000  . ampicillin-sulbactam (UNASYN) IV 3 g (08/08/19 0755)  . azithromycin 500 mg (08/07/19 2152)  . fluconazole (DIFLUCAN) IV 100 mg (08/08/19 0859)    Assessment/Plan:  1. Acute hypoxic respiratory failure.  Patient was on high flow nasal cannula 38% this morning when I saw him.  Nurse was able to flip him over to the bubble high flow nasal cannula.  Likely will be able to go out of the ICU stepdown unit today.  Continue nebulizer treatments. 2. Aspiration pneumonia from tube feeds.  Continue Unasyn and Zithromax. 3. Patient unable to tolerate tube feedings, with nausea vomiting.  Started Reglan yesterday.  Appreciate GI consultation.  Holding tube feedings at  this time.  CT abdomen pelvis does not show any blockage or anything that would cause persistent nausea vomiting.  Consulted dietitian again to start tube feedings may be had 1/2 can 4 times a day and see what happens. 4. Hyponatremia.  Improved with IV fluid hydration 5. Yeast seen on tracheal aspirate.  Started Diflucan.  Since patient has vomiting maybe does have Candida esophagitis.  Continue Diflucan for now. 6. Hypothyroidism on levothyroxine 7. GERD on Protonix 8. History of throat cancer with dysphagia. 9. Mediastinal adenopathy.  Recommend PET CT scan as outpatient for further evaluation.  Code Status:  Code Status Orders  (From admission, onward)         Start     Ordered   08/05/19 2010  Full code  Continuous     08/05/19 2019        Code Status History    Date Active Date Inactive Code Status Order ID Comments User Context   09/08/2018 0404 09/09/2018 1913 Full Code [id number]  Harrie Foreman, MD Inpatient   Advance Care Planning Activity    Advance Directive Documentation     Most Recent Value  Type of Advance Directive  Healthcare Power of Attorney, Living will  Pre-existing out of facility DNR order (yellow form or pink MOST form)  --  "MOST" Form in Place?  --     Family Communication: Spoke with the patient's wife on the phone Disposition Plan: We will need to tolerate tube feedings and be off oxygen prior to disposition  Antibiotics:  Unasyn  Zithromax  Diflucan  Time spent: 26 minutes.  [place]  [facility] [employer]

## 2019-08-08 NOTE — Consult Note (Signed)
Jonathon Bellows , MD [address], [place], [place], [place], [address] [address], [place], [place], [place], [address] Phone: [phone]  Fax: [phone]  Consultation  Referring Provider:   Dr. Vito Berger primary Care Physician:  System, Pcp Not In Primary Gastroenterologist: None    Reason for Consultation: Recurrent aspiration  Date of Admission:  08/05/2019 Date of Consultation:  08/08/2019         HPI:   Mark Andrews is a 73 y.o. male who had a G-tube for previous throat cancer.  For previous history of aspiration presented to the emergency room with intractable nausea and vomiting on 08/05/2019.  G-tube is in place for over 6 months.  In the emergency room he was hypoxic, had leukocytosis, right pleural effusion with stable bibasilar scarring and faint right upper lobe opacity concerning for pneumonia.  Treated with IV antibiotics.  08/07/2019: CT scan of the abdomen and pelvis with contrast shows large left lower lobe airspace opacity concerning for pneumonia.  Minimal right pleural effusion.  Mediastinal adenopathy concerning for metastatic disease.  Gastrostomy tube in good position.  He says that he feeds himself via gravity tyhrough the G tube at home- sounds like a bolus feeding regime, along with a good qty of water through the tube. Denies any bloating or abdominal distension. Presently no vomiting.   Past Medical History:  Diagnosis Date  . Aspiration into respiratory tract   . Cancer (HCC)    throat  . GERD (gastroesophageal reflux disease)   . Hypothyroidism     Past Surgical History:  Procedure Laterality Date  . GASTROSTOMY TUBE PLACEMENT    . none    . TONSILLECTOMY     with CANCER    Prior to Admission medications   Medication Sig Start Date End Date Taking? Authorizing Provider  levothyroxine (SYNTHROID, LEVOTHROID) 75 MCG tablet Take 75 mcg by mouth daily before breakfast.   Yes [provider]  pantoprazole (PROTONIX) 40 MG tablet Take  40 mg by mouth daily.   Yes [provider]  sodium fluoride (FLUORISHIELD) 1.1 % GEL dental gel Place 1 application onto teeth daily.   Yes [provider]    Family History  Problem Relation Age of Onset  . Pulmonary embolism Mother        and DVT     Social History   Tobacco Use  . Smoking status: Never Smoker  . Smokeless tobacco: Never Used  Substance Use Topics  . Alcohol use: Not Currently  . Drug use: Not Currently    Allergies as of 08/05/2019  . (No Known Allergies)    Review of Systems:    All systems reviewed and negative except where noted in HPI.   Physical Exam:  Vital signs in last 24 hours: Temp:  [97.9 F (36.6 C)-98.5 F (36.9 C)] 97.9 F (36.6 C) (12/11 0800) Pulse Rate:  [70-94] 83 (12/11 1000) Resp:  [15-32] 20 (12/11 1000) BP: (100-170)/(67-126) 149/125 (12/11 1000) SpO2:  [90 %-100 %] 94 % (12/11 1000) FiO2 (%):  [40 %-50 %] 40 % (12/11 0840) Last BM Date: 08/05/19 General:   Pleasant, cooperative in NAD Head:  Normocephalic and atraumatic. Eyes:   No icterus.   Conjunctiva pink. PERRLA. Ears:  Normal auditory acuity. Neck:  Supple; no masses or thyroidomegaly Lungs: Respirations even and unlabored. Lungs: b/l coarse breath sounds.   No wheezes, crackles, or rhonchi.  Heart:  Regular rate and rhythm;  Without murmur, clicks, rubs or gallops Abdomen:  G tube in site appears normal Soft, nondistended, nontender. Normal bowel sounds. No appreciable masses or hepatomegaly.  No rebound or guarding.  Neurologic:  Alert and oriented x3;  grossly normal neurologically. Cervical Nodes:  No significant cervical adenopathy. Psych:  Alert and cooperative. Normal affect.  LAB RESULTS: Recent Labs    08/05/19 1437 08/06/19 0620 08/08/19 0659  WBC 11.9* 10.3 12.2*  HGB 13.8 12.4* 12.3*  HCT 41.3 35.9* 36.9*  PLT 361 285 365   BMET Recent Labs    08/05/19 1437 08/06/19 0620 08/08/19 0659  NA 128* 130* 135  K 4.6 4.4 4.0    CL 88* 100 99  CO2 28 27 25   GLUCOSE 108* 101* 94  BUN 18 13 17   CREATININE 0.94 0.60* 0.80  CALCIUM 8.8* 8.6* 8.9   LFT Recent Labs    08/06/19 0620  PROT 7.1  ALBUMIN 2.8*  AST 24  ALT 31  ALKPHOS 143*  BILITOT 0.7   PT/INR Recent Labs    08/05/19 1847  LABPROT 13.2  INR 1.0    STUDIES: DG Chest 1 View  Result Date: 08/06/2019 CLINICAL DATA:  Shortness of breath EXAM: CHEST  1 VIEW COMPARISON:  08/05/2019 FINDINGS: Cardiac shadow is stable. Previously seen right upper lobe density has increased slightly in the interval consistent with developing infiltrate. New left basilar atelectasis and small left effusion is noted. No acute bony abnormality is noted. IMPRESSION: Increase in left basilar atelectasis and small effusion. Patchy increase in density in the right upper lobe consistent with developing infiltrate. Electronically Signed   By: Inez Catalina M.D.   On: 08/06/2019 22:41   CT ANGIO CHEST PE W OR WO CONTRAST  Result Date: 08/07/2019 CLINICAL DATA:  Nausea, vomiting. Shortness of breath. History of throat cancer. EXAM: CT ANGIOGRAPHY CHEST CT ABDOMEN AND PELVIS WITH CONTRAST TECHNIQUE: Multidetector CT imaging of the chest was performed using the standard protocol during bolus administration of intravenous contrast. Multiplanar CT image reconstructions and MIPs were obtained to evaluate the vascular anatomy. Multidetector CT imaging of the abdomen and pelvis was performed using the standard protocol during bolus administration of intravenous contrast. CONTRAST:  50mL OMNIPAQUE IOHEXOL 350 MG/ML SOLN COMPARISON:  September 08, 2018. FINDINGS: CTA CHEST FINDINGS Cardiovascular: Satisfactory opacification of the pulmonary arteries to the segmental level. No evidence of pulmonary embolism. Normal heart size. No pericardial effusion. Mediastinum/Nodes: Thyroid gland and esophagus are unremarkable. 2.1 cm right hilar lymph node is noted. 1.6 cm precarinal lymph node is noted. 1.8  cm subcarinal lymph node is noted. Lungs/Pleura: No pneumothorax is noted. Minimal right pleural effusion is noted with adjacent subsegmental atelectasis. Left lower lobe atelectasis or pneumonia is noted. Musculoskeletal: No chest wall abnormality. No acute or significant osseous findings. Review of the MIP images confirms the above findings. CT ABDOMEN and PELVIS FINDINGS Hepatobiliary: Multiple large gallstones are noted. No biliary dilatation is noted. Liver is unremarkable. Pancreas: Unremarkable. No pancreatic ductal dilatation or surrounding inflammatory changes. Spleen: Normal in size without focal abnormality. Adrenals/Urinary Tract: Adrenal glands appear normal. Right renal cysts are noted. No hydronephrosis or renal obstruction is noted. No renal or ureteral calculi are noted. Urinary bladder is unremarkable. Stomach/Bowel: Gastrostomy tube is well positioned. There is no evidence of bowel obstruction or inflammation. The appendix is unremarkable. Vascular/Lymphatic: Aortic atherosclerosis. No enlarged abdominal or pelvic lymph nodes. Reproductive: Mild prostatic enlargement is noted. Other: No abdominal wall hernia or abnormality. No abdominopelvic ascites. Musculoskeletal: No acute or significant osseous findings. Review of the  MIP images confirms the above findings. IMPRESSION: No definite evidence of pulmonary embolus. Mediastinal adenopathy is again noted concerning for metastatic disease. Large left lower lobe airspace opacity is noted concerning for pneumonia or atelectasis. Minimal right pleural effusion is noted with adjacent subsegmental atelectasis. Cholelithiasis without inflammation. Gastrostomy tube in grossly good position. Mild prostatic enlargement. Aortic Atherosclerosis (ICD10-I70.0). Electronically Signed   By: Marijo Conception M.D.   On: 08/07/2019 14:00   CT ABDOMEN PELVIS W CONTRAST  Result Date: 08/07/2019 CLINICAL DATA:  Nausea, vomiting. Shortness of breath. History of throat  cancer. EXAM: CT ANGIOGRAPHY CHEST CT ABDOMEN AND PELVIS WITH CONTRAST TECHNIQUE: Multidetector CT imaging of the chest was performed using the standard protocol during bolus administration of intravenous contrast. Multiplanar CT image reconstructions and MIPs were obtained to evaluate the vascular anatomy. Multidetector CT imaging of the abdomen and pelvis was performed using the standard protocol during bolus administration of intravenous contrast. CONTRAST:  20mL OMNIPAQUE IOHEXOL 350 MG/ML SOLN COMPARISON:  September 08, 2018. FINDINGS: CTA CHEST FINDINGS Cardiovascular: Satisfactory opacification of the pulmonary arteries to the segmental level. No evidence of pulmonary embolism. Normal heart size. No pericardial effusion. Mediastinum/Nodes: Thyroid gland and esophagus are unremarkable. 2.1 cm right hilar lymph node is noted. 1.6 cm precarinal lymph node is noted. 1.8 cm subcarinal lymph node is noted. Lungs/Pleura: No pneumothorax is noted. Minimal right pleural effusion is noted with adjacent subsegmental atelectasis. Left lower lobe atelectasis or pneumonia is noted. Musculoskeletal: No chest wall abnormality. No acute or significant osseous findings. Review of the MIP images confirms the above findings. CT ABDOMEN and PELVIS FINDINGS Hepatobiliary: Multiple large gallstones are noted. No biliary dilatation is noted. Liver is unremarkable. Pancreas: Unremarkable. No pancreatic ductal dilatation or surrounding inflammatory changes. Spleen: Normal in size without focal abnormality. Adrenals/Urinary Tract: Adrenal glands appear normal. Right renal cysts are noted. No hydronephrosis or renal obstruction is noted. No renal or ureteral calculi are noted. Urinary bladder is unremarkable. Stomach/Bowel: Gastrostomy tube is well positioned. There is no evidence of bowel obstruction or inflammation. The appendix is unremarkable. Vascular/Lymphatic: Aortic atherosclerosis. No enlarged abdominal or pelvic lymph nodes.  Reproductive: Mild prostatic enlargement is noted. Other: No abdominal wall hernia or abnormality. No abdominopelvic ascites. Musculoskeletal: No acute or significant osseous findings. Review of the MIP images confirms the above findings. IMPRESSION: No definite evidence of pulmonary embolus. Mediastinal adenopathy is again noted concerning for metastatic disease. Large left lower lobe airspace opacity is noted concerning for pneumonia or atelectasis. Minimal right pleural effusion is noted with adjacent subsegmental atelectasis. Cholelithiasis without inflammation. Gastrostomy tube in grossly good position. Mild prostatic enlargement. Aortic Atherosclerosis (ICD10-I70.0). Electronically Signed   By: Marijo Conception M.D.   On: 08/07/2019 14:00   DG Abd Portable 2V  Result Date: 08/07/2019 CLINICAL DATA:  Nausea and vomiting. EXAM: PORTABLE ABDOMEN - 2 VIEW COMPARISON:  Single-view of the chest 08/06/2019. FINDINGS: Feeding tube is in place. Bowel gas pattern is nonobstructive. No free intraperitoneal air. There is a small left pleural effusion and left basilar airspace disease. IMPRESSION: Negative abdomen.  Feeding tube noted. Left pleural effusion and basilar airspace disease as seen on chest film yesterday. Electronically Signed   By: Inge Rise M.D.   On: 08/07/2019 12:46   US Abdomen Limited RUQ  Result Date: 08/06/2019 CLINICAL DATA:  Nausea, vomiting and shortness of breath for 1 week EXAM: ULTRASOUND ABDOMEN LIMITED RIGHT UPPER QUADRANT COMPARISON:  Chest angiography 09/08/2018 FINDINGS: Gallbladder: The gallbladder contains multiple calcified shadowing  gallstones, largest measuring up to 1.7 cm in maximal diameter. No gallbladder wall thickening or pericholecystic fluid is seen. Sonographic Percell Miller sign is reportedly negative Common bile duct: Diameter: 2.2 mm, nondilated Liver: There is a coarsened, heterogeneous echotexture of the liver with some mild surface nodularity. No focal liver  lesions are seen. Portal vein is patent on color Doppler imaging with normal direction of blood flow towards the liver. Other: None. IMPRESSION: Cholelithiasis without sonographic evidence of acute cholecystitis. Heterogeneous, coarsened and nodular liver, may suggest underlying intrinsic liver disease such as cirrhosis. Electronically Signed   By: Lovena Le M.D.   On: 08/06/2019 13:44      Impression / Plan:   Telley Boice is a 73 y.o. y/o male with throat cancer who has had a G-tube in place for over 6 months comes into the hospital with aspiration pneumonia.  I have been consulted for aspiration pneumonia.  A G-tube does not prevent aspiration pneumonia.  No obvious evidence of bowel obstruction or gastric outlet obstruction seen on the CT scan.Likely self feeding a large qty too fast over a very short periods of time.   Consider to check for residuals and restart at a lower rate when ready.  If there is concerning for gastric outlet obstruction considered a contrast study through the G-tube with radiology.  If issue of aspiration is recurrent then may need to consider a surgical jejunostomy tube.  Keep head end of the bed elevated at 45 degrees.  Thank you for involving me in the care of this patient.      LOS: 3 days   Jonathon Bellows, MD  08/08/2019, 10:11 AM

## 2019-08-09 DIAGNOSIS — E43 Unspecified severe protein-calorie malnutrition: Secondary | ICD-10-CM

## 2019-08-09 DIAGNOSIS — Z931 Gastrostomy status: Secondary | ICD-10-CM

## 2019-08-09 LAB — BASIC METABOLIC PANEL
Anion gap: 11 (ref 5–15)
BUN: 14 mg/dL (ref 8–23)
CO2: 26 mmol/L (ref 22–32)
Calcium: 8.3 mg/dL — ABNORMAL LOW (ref 8.9–10.3)
Chloride: 98 mmol/L (ref 98–111)
Creatinine, Ser: 0.71 mg/dL (ref 0.61–1.24)
GFR calc Af Amer: >60 mL/min (ref >60)
GFR calc non Af Amer: >60 mL/min (ref >60)
Glucose, Bld: 85 mg/dL (ref 70–99)
Potassium: 3.8 mmol/L (ref 3.5–5.1)
Sodium: 135 mmol/L (ref 135–145)

## 2019-08-09 LAB — CULTURE, RESPIRATORY W GRAM STAIN

## 2019-08-09 LAB — CBC
HCT: 35.9 % — ABNORMAL LOW (ref 39.0–52.0)
Hemoglobin: 12.1 g/dL — ABNORMAL LOW (ref 13.0–17.0)
MCH: 30 pg (ref 26.0–34.0)
MCHC: 33.7 g/dL (ref 30.0–36.0)
MCV: 88.9 fL (ref 80.0–100.0)
Platelets: 373 10*3/uL (ref 150–400)
RBC: 4.04 MIL/uL — ABNORMAL LOW (ref 4.22–5.81)
RDW: 11.9 % (ref 11.5–15.5)
WBC: 10.4 10*3/uL (ref 4.0–10.5)
nRBC: 0 % (ref 0.0–0.2)

## 2019-08-09 MED ORDER — SODIUM CHLORIDE 0.9 % IV SOLN
2.0000 g | INTRAVENOUS | Status: DC
  Administered 2019-08-09 – 2019-08-11 (×3): 2 g via INTRAVENOUS
  Filled 2019-08-09: qty 20
  Filled 2019-08-09 (×3): qty 2

## 2019-08-09 NOTE — Progress Notes (Signed)
Vonda Antigua, MD [address] [place], [place], [place], [address] [address], [place], [place], [place], [address] Phone: [phone]  Fax: [phone]   Subjective:  Patient reports now doing well with his feeds.  No abdominal pain.  No nausea or vomiting.  Objective: Exam: Vital signs in last 24 hours: Vitals:   08/08/19 1644 08/08/19 2046 08/09/19 0547 08/09/19 1341  BP: (!) 149/98 (!) 157/91 (!) 152/99 117/87  Pulse: 92 94 90 85  Resp:  18 20   Temp:  97.9 F (36.6 C) 97.7 F (36.5 C) 98.1 F (36.7 C)  TempSrc:  Oral Oral Oral  SpO2: 99% 97% 96% 96%  Weight:      Height:       Weight change:   Intake/Output Summary (Last 24 hours) at 08/09/2019 1616 Last data filed at 08/09/2019 1604 Gross per 24 hour  Intake 1409.82 ml  Output 3275 ml  Net -1865.18 ml    General: No acute distress, AAO x3 Abd: Soft, NT/ND, No HSM Skin: Warm, no rashes Neck: Supple, Trachea midline   Lab Results: Lab Results  Component Value Date   WBC 10.4 08/09/2019   HGB 12.1 (L) 08/09/2019   HCT 35.9 (L) 08/09/2019   MCV 88.9 08/09/2019   PLT 373 08/09/2019   Micro Results: Recent Results (from the past 240 hour(s))  Urine culture     Status: Abnormal   Collection Time: 08/05/19  6:36 PM   Specimen: In/Out Cath Urine  Result Value Ref Range Status   Specimen Description   Final    IN/OUT CATH URINE Performed at [facility], [address] [place], [place] [address]    Special Requests   Final    NONE Performed at [facility], [address] [place], [place] [address]    Culture 100 COLONIES/mL STAPHYLOCOCCUS HAEMOLYTICUS (A)  Final   Report Status 08/08/2019 FINAL  Final   Organism ID, Bacteria STAPHYLOCOCCUS HAEMOLYTICUS (A)  Final      Susceptibility   Staphylococcus haemolyticus - MIC*    CIPROFLOXACIN <=0.5 SENSITIVE Sensitive     GENTAMICIN <=0.5 SENSITIVE Sensitive     NITROFURANTOIN <=16 SENSITIVE Sensitive    OXACILLIN >=4 RESISTANT Resistant     TETRACYCLINE >=16 RESISTANT Resistant     VANCOMYCIN <=0.5 SENSITIVE Sensitive     TRIMETH/SULFA <=10 SENSITIVE Sensitive     CLINDAMYCIN <=0.25 SENSITIVE Sensitive     RIFAMPIN <=0.5 SENSITIVE Sensitive     Inducible Clindamycin NEGATIVE Sensitive     * 100 COLONIES/mL STAPHYLOCOCCUS HAEMOLYTICUS  Blood Culture (routine x 2)     Status: None (Preliminary result)   Collection Time: 08/05/19  6:47 PM   Specimen: BLOOD  Result Value Ref Range Status   Specimen Description BLOOD RIGHT ANTECUBITAL  Final   Special Requests   Final    BOTTLES DRAWN AEROBIC AND ANAEROBIC Blood Culture adequate volume   Culture   Final    NO GROWTH 4 DAYS Performed at [facility], [address] [place], [place] [address]    Report Status PENDING  Incomplete  Blood Culture (routine x 2)     Status: None (Preliminary result)   Collection Time: 08/05/19  6:47 PM   Specimen: BLOOD  Result Value Ref Range Status   Specimen Description BLOOD BLOOD LEFT FOREARM  Final   Special Requests   Final    BOTTLES DRAWN AEROBIC AND ANAEROBIC Blood Culture adequate volume   Culture   Final  NO GROWTH 4 DAYS Performed at [facility], [place], [place] [address]    Report Status PENDING  Incomplete  SARS CORONAVIRUS 2 (TAT 6-24 HRS) Nasopharyngeal Nasopharyngeal Swab     Status: None   Collection Time: 08/05/19  8:33 PM   Specimen: Nasopharyngeal Swab  Result Value Ref Range Status   SARS Coronavirus 2 NEGATIVE NEGATIVE Final    Comment: (NOTE) SARS-CoV-2 target nucleic acids are NOT DETECTED. The SARS-CoV-2 RNA is generally detectable in upper and lower respiratory specimens during the acute phase of infection. Negative results do not preclude SARS-CoV-2 infection, do not rule out co-infections with other pathogens, and should not be used as the sole basis for treatment or other patient management decisions. Negative results must be  combined with clinical observations, patient history, and epidemiological information. The expected result is Negative. Fact Sheet for Patients: SugarRoll.be Fact Sheet for Healthcare Providers: https://www.woods-mathews.com/ This test is not yet approved or cleared by the [place] FDA and  has been authorized for detection and/or diagnosis of SARS-CoV-2 by FDA under an Emergency Use Authorization (EUA). This EUA will remain  in effect (meaning this test can be used) for the duration of the COVID-19 declaration under Section 56 4(b)(1) of the Act, 21 U.S.C. section 360bbb-3(b)(1), unless the authorization is terminated or revoked sooner. Performed at [facility], [place] [address], [place], [place] [address]   Culture, sputum-assessment     Status: None   Collection Time: 08/05/19  8:33 PM   Specimen: Sputum  Result Value Ref Range Status   Specimen Description SPUTUM  Final   Special Requests NONE  Final   Sputum evaluation   Final    THIS SPECIMEN IS ACCEPTABLE FOR SPUTUM CULTURE Performed at [facility], [address] [place], [place] [address]    Report Status 08/05/2019 FINAL  Final  Culture, respiratory     Status: None   Collection Time: 08/05/19  8:33 PM   Specimen: SPU  Result Value Ref Range Status   Specimen Description   Final    SPUTUM Performed at [facility], [address] [place], [place] [address]    Special Requests   Final    NONE Reflexed from [id number] Performed at [facility], [address] [place], [place] [address]    Gram Stain   Final    MODERATE WBC PRESENT, PREDOMINANTLY PMN MODERATE GRAM POSITIVE COCCI IN CLUSTERS IN CHAINS FEW GRAM NEGATIVE RODS FEW GRAM POSITIVE RODS FEW BUDDING YEAST SEEN Performed at [facility], [place] [address], [place],  [address]    Culture ABUNDANT Su Hoff  Final   Report Status 08/09/2019 FINAL  Final    Organism ID, Bacteria CITROBACTER YOUNGAE  Final      Susceptibility   Citrobacter youngae - MIC*    CEFAZOLIN >=64 RESISTANT Resistant     CEFEPIME <=1 SENSITIVE Sensitive     CEFTAZIDIME <=1 SENSITIVE Sensitive     CEFTRIAXONE <=1 SENSITIVE Sensitive     CIPROFLOXACIN <=0.25 SENSITIVE Sensitive     GENTAMICIN <=1 SENSITIVE Sensitive     IMIPENEM <=0.25 SENSITIVE Sensitive     TRIMETH/SULFA <=20 SENSITIVE Sensitive     PIP/TAZO <=4 SENSITIVE Sensitive     * ABUNDANT CITROBACTER YOUNGAE  MRSA PCR Screening     Status: None   Collection Time: 08/07/19  2:00 AM   Specimen: Nasopharyngeal  Result Value Ref Range Status   MRSA by PCR NEGATIVE  NEGATIVE Final    Comment:        The GeneXpert MRSA Assay (FDA approved for NASAL specimens only), is one component of a comprehensive MRSA colonization surveillance program. It is not intended to diagnose MRSA infection nor to guide or monitor treatment for MRSA infections. Performed at [facility], [address] [place],  [address]    Studies/Results: No results found. Medications:  Scheduled Meds: . budesonide (PULMICORT) nebulizer solution  0.5 mg Nebulization BID  . Chlorhexidine Gluconate Cloth  6 each Topical Daily  . enoxaparin (LOVENOX) injection  40 mg Subcutaneous Q24H  . feeding supplement (OSMOLITE 1.5 CAL)  120 mL Per Tube QID  . free water  120 mL Per Tube QID  . free water  60 mL Per Tube BID  . levothyroxine  75 mcg Per Tube Q0600  . mouth rinse  15 mL Mouth Rinse BID  . metoCLOPramide (REGLAN) injection  5 mg Intravenous Q6H  . pantoprazole (PROTONIX) IV  40 mg Intravenous Q24H  . sodium fluoride  1 application dental Daily   Continuous Infusions: . sodium chloride Stopped (08/09/19 1050)  . cefTRIAXone (ROCEPHIN)  IV    . fluconazole (DIFLUCAN) IV 50 mL/hr at 08/09/19 1141   PRN Meds:.acetaminophen **OR** acetaminophen, ALPRAZolam, guaiFENesin, ipratropium-albuterol, magnesium hydroxide,  [DISCONTINUED] ondansetron **OR** ondansetron (ZOFRAN) IV, traZODone   Assessment: Active Problems:   Aspiration pneumonia (HCC)   Hyponatremia   Hypothyroidism   Gastroesophageal reflux disease without esophagitis   Nausea and vomiting   Acute respiratory failure with hypoxia ([place])   Mediastinal lymphadenopathy   Protein-calorie malnutrition, severe    Plan: Patient tolerating altered rate of feeds much better  Continue working with nutrition and feed rate as per dietitian recommendation  GI service will sign off at this time, please page with any questions   LOS: 4 days   Vonda Antigua, MD 08/09/2019, 4:16 PM

## 2019-08-09 NOTE — Progress Notes (Addendum)
Patient ID: Mark Andrews, male   DOB: [birth year], 73 y.o.   MRN: [id number] [facility] PROGRESS NOTE  Mark Andrews [id number] DOB: 14-Apr-1946 DOA: 08/05/2019 PCP: System, Pcp Not In  HPI/Subjective: Patient is feeling better.  Was able to be titrated down to 3 L nasal cannula this morning.  Still has a little cough and a little shortness of breath and some nausea.  Had a bowel movement yesterday.  Tolerated the half can of new feeding last night and this morning.  Objective: Vitals:   08/09/19 0547 08/09/19 1341  BP: (!) 152/99 117/87  Pulse: 90 85  Resp: 20   Temp: 97.7 F (36.5 C) 98.1 F (36.7 C)  SpO2: 96% 96%    Intake/Output Summary (Last 24 hours) at 08/09/2019 1443 Last data filed at 08/09/2019 1411 Gross per 24 hour  Intake 1791.66 ml  Output 3175 ml  Net -1383.34 ml   Filed Weights   08/05/19 1431 08/07/19 0300  Weight: 68.9 kg 69.9 kg    ROS: Review of Systems  Constitutional: Negative for chills and fever.  Eyes: Negative for blurred vision.  Respiratory: Positive for cough and shortness of breath.   Cardiovascular: Negative for chest pain.  Gastrointestinal: Positive for nausea. Negative for abdominal pain, constipation, diarrhea and vomiting.  Genitourinary: Negative for dysuria.  Musculoskeletal: Negative for joint pain.  Neurological: Negative for dizziness and headaches.   Exam: Physical Exam  Constitutional: He is oriented to person, place, and time.  HENT:  Nose: No mucosal edema.  Mouth/Throat: No oropharyngeal exudate or posterior oropharyngeal edema.  Eyes: Pupils are equal, round, and reactive to light. Conjunctivae, EOM and lids are normal.  Neck: No JVD present. Carotid bruit is not present. No thyroid mass and no thyromegaly present.  Cardiovascular: S1 normal and S2 normal. Exam reveals no gallop.  No murmur heard. Pulses:      Dorsalis pedis pulses are 2+ on the right side and 2+ on the left side.  Respiratory: No respiratory  distress. He has decreased breath sounds in the right lower field and the left lower field. He has no wheezes. He has rhonchi in the right lower field and the left lower field. He has no rales.  GI: Soft. Bowel sounds are normal. There is no abdominal tenderness.  Musculoskeletal:     Cervical back: No edema.     Right ankle: No swelling.     Left ankle: No swelling.  Lymphadenopathy:    He has no cervical adenopathy.  Neurological: He is alert and oriented to person, place, and time. No cranial nerve deficit.  Skin: Skin is warm. No rash noted. Nails show no clubbing.  Psychiatric: He has a normal mood and affect.      Data Reviewed: Basic Metabolic Panel: Recent Labs  Lab 08/05/19 1437 08/06/19 0620 08/08/19 0659 08/09/19 0404  NA 128* 130* 135 135  K 4.6 4.4 4.0 3.8  CL 88* 100 99 98  CO2 28 27 25 26   GLUCOSE 108* 101* 94 85  BUN 18 13 17 14   CREATININE 0.94 0.60* 0.80 0.71  CALCIUM 8.8* 8.6* 8.9 8.3*   Liver Function Tests: Recent Labs  Lab 08/06/19 0620  AST 24  ALT 31  ALKPHOS 143*  BILITOT 0.7  PROT 7.1  ALBUMIN 2.8*   CBC: Recent Labs  Lab 08/05/19 1437 08/06/19 0620 08/08/19 0659 08/09/19 0404  WBC 11.9* 10.3 12.2* 10.4  NEUTROABS 9.1*  --   --   --   HGB 13.8  12.4* 12.3* 12.1*  HCT 41.3 35.9* 36.9* 35.9*  MCV 90.2 87.1 90.2 88.9  PLT 361 285 365 373   BNP (last 3 results) Recent Labs    09/07/18 2258  BNP 57.0     Scheduled Meds: . budesonide (PULMICORT) nebulizer solution  0.5 mg Nebulization BID  . Chlorhexidine Gluconate Cloth  6 each Topical Daily  . enoxaparin (LOVENOX) injection  40 mg Subcutaneous Q24H  . feeding supplement (OSMOLITE 1.5 CAL)  120 mL Per Tube QID  . free water  120 mL Per Tube QID  . free water  60 mL Per Tube BID  . levothyroxine  75 mcg Per Tube Q0600  . mouth rinse  15 mL Mouth Rinse BID  . metoCLOPramide (REGLAN) injection  5 mg Intravenous Q6H  . pantoprazole (PROTONIX) IV  40 mg Intravenous Q24H  .  sodium fluoride  1 application dental Daily   Continuous Infusions: . sodium chloride Stopped (08/09/19 1050)  . ampicillin-sulbactam (UNASYN) IV 3 g (08/09/19 1411)  . fluconazole (DIFLUCAN) IV 50 mL/hr at 08/09/19 1141    Assessment/Plan:  1. Acute hypoxic respiratory failure.  Patient was tapered off high flow nasal cannula down to regular nasal cannula 3 L this morning.  Continue nebulizer treatments. 2. Aspiration pneumonia from tube feeds.  Severe malnutrition in context with illness.  Switch antibiotics over to Rocephin to cover Citrobacter in the sputum culture.  Dietitian trying low rate of tube feeds and new formulation to see if he tolerates.  Continue Reglan.  So far tolerated as of this morning.  Continue to monitor closely. 3. Hyponatremia.  Improved with IV fluid hydration 4. Yeast seen on tracheal aspirate.  Started Diflucan.  Since patient has vomiting maybe does have Candida esophagitis.  Continue Diflucan for now. 5. 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 GERD on Protonix 7. History of throat cancer with dysphagia. 8. Mediastinal adenopathy.  Recommend PET CT scan as outpatient for further evaluation.  Code Status:     Code Status Orders  (From admission, onward)         Start     Ordered   08/05/19 2010  Full code  Continuous     08/05/19 2019        Code Status History    Date Active Date Inactive Code Status Order ID Comments User Context   09/08/2018 0404 09/09/2018 1913 Full Code [id number]  Harrie Foreman, MD Inpatient   Advance Care Planning Activity    Advance Directive Documentation     Most Recent Value  Type of Advance Directive  Healthcare Power of Attorney, Living will  Pre-existing out of facility DNR order (yellow form or pink MOST form)  --  "MOST" Form in Place?  --     Family Communication: Spoke with the patient's wife on the phone Disposition Plan: We will need to tolerate tube feedings and be off oxygen prior to  disposition  Antibiotics:  Rocephin  Diflucan  Time spent: 27 minutes.  [place]  [facility] [employer] [facility]

## 2019-08-10 LAB — CULTURE, BLOOD (ROUTINE X 2)
Culture: NO GROWTH
Culture: NO GROWTH
Special Requests: ADEQUATE
Special Requests: ADEQUATE

## 2019-08-10 MED ORDER — FREE WATER
60.0000 mL | Freq: Four times a day (QID) | Status: DC
  Administered 2019-08-10 – 2019-08-11 (×5): 60 mL

## 2019-08-10 MED ORDER — FREE WATER: 180.0000 mL | Freq: Four times a day (QID) | Status: DC

## 2019-08-10 MED ORDER — HYDROCORTISONE 1 % EX CREA
TOPICAL_CREAM | Freq: Three times a day (TID) | CUTANEOUS | Status: DC
  Administered 2019-08-10: 22:00:00 via TOPICAL
  Administered 2019-08-11: 1 via TOPICAL
  Administered 2019-08-11 (×2): via TOPICAL
  Filled 2019-08-10: qty 28

## 2019-08-10 MED ORDER — OSMOLITE 1.5 CAL PO LIQD
180.0000 mL | Freq: Four times a day (QID) | ORAL | Status: DC
  Administered 2019-08-10 (×4): 180 mL

## 2019-08-10 NOTE — Progress Notes (Signed)
Patient ID: Mark Andrews, male   DOB: 02-Jan-1946, 73 y.o.   MRN: [id number] [facility] PROGRESS NOTE  Mark Andrews [id number] DOB: 1946-03-19 DOA: 08/05/2019 PCP: System, Pcp Not In  HPI/Subjective: Patient feels okay.  States his breathing is better.  Still having some cough.  Does not want any medication for high blood pressure at this time.  Tolerating his tube feeds and wanting to go up on the rate.  Objective: Vitals:   08/10/19 0802 08/10/19 1126  BP: (!) 154/118   Pulse: 86   Resp: 20   Temp: 97.8 F (36.6 C)   SpO2: 96% 92%    Intake/Output Summary (Last 24 hours) at 08/10/2019 1305 Last data filed at 08/10/2019 0804 Gross per 24 hour  Intake 1002.2 ml  Output 1525 ml  Net -522.8 ml   Filed Weights   08/05/19 1431 08/07/19 0300  Weight: 68.9 kg 69.9 kg    ROS: Review of Systems  Constitutional: Negative for chills and fever.  Eyes: Negative for blurred vision.  Respiratory: Positive for cough. Negative for shortness of breath.   Cardiovascular: Negative for chest pain.  Gastrointestinal: Negative for abdominal pain, constipation, diarrhea, nausea and vomiting.  Genitourinary: Negative for dysuria.  Musculoskeletal: Negative for joint pain.  Neurological: Negative for dizziness and headaches.   Exam: Physical Exam  Constitutional: He is oriented to person, place, and time.  HENT:  Nose: No mucosal edema.  Mouth/Throat: No oropharyngeal exudate or posterior oropharyngeal edema.  Eyes: Pupils are equal, round, and reactive to light. Conjunctivae, EOM and lids are normal.  Neck: No JVD present. Carotid bruit is not present. No thyroid mass and no thyromegaly present.  Cardiovascular: S1 normal and S2 normal. Exam reveals no gallop.  No murmur heard. Pulses:      Dorsalis pedis pulses are 2+ on the right side and 2+ on the left side.  Respiratory: No respiratory distress. He has no decreased breath sounds. He has no wheezes. He has no rhonchi. He has  no rales.  Coarse breath sounds throughout the lungs.  GI: Soft. Bowel sounds are normal. There is no abdominal tenderness.  Musculoskeletal:     Cervical back: No edema.     Right ankle: No swelling.     Left ankle: No swelling.  Lymphadenopathy:    He has no cervical adenopathy.  Neurological: He is alert and oriented to person, place, and time. No cranial nerve deficit.  Skin: Skin is warm. No rash noted. Nails show no clubbing.  Psychiatric: He has a normal mood and affect.      Data Reviewed: Basic Metabolic Panel: Recent Labs  Lab 08/05/19 1437 08/06/19 0620 08/08/19 0659 08/09/19 0404  NA 128* 130* 135 135  K 4.6 4.4 4.0 3.8  CL 88* 100 99 98  CO2 28 27 25 26   GLUCOSE 108* 101* 94 85  BUN 18 13 17 14   CREATININE 0.94 0.60* 0.80 0.71  CALCIUM 8.8* 8.6* 8.9 8.3*   Liver Function Tests: Recent Labs  Lab 08/06/19 0620  AST 24  ALT 31  ALKPHOS 143*  BILITOT 0.7  PROT 7.1  ALBUMIN 2.8*   CBC: Recent Labs  Lab 08/05/19 1437 08/06/19 0620 08/08/19 0659 08/09/19 0404  WBC 11.9* 10.3 12.2* 10.4  NEUTROABS 9.1*  --   --   --   HGB 13.8 12.4* 12.3* 12.1*  HCT 41.3 35.9* 36.9* 35.9*  MCV 90.2 87.1 90.2 88.9  PLT 361 285 365 373   BNP (last 3 results)  Recent Labs    09/07/18 2258  BNP 57.0     Scheduled Meds: . budesonide (PULMICORT) nebulizer solution  0.5 mg Nebulization BID  . Chlorhexidine Gluconate Cloth  6 each Topical Daily  . enoxaparin (LOVENOX) injection  40 mg Subcutaneous Q24H  . feeding supplement (OSMOLITE 1.5 CAL)  180 mL Per Tube QID  . free water  60 mL Per Tube QID  . levothyroxine  75 mcg Per Tube Q0600  . mouth rinse  15 mL Mouth Rinse BID  . metoCLOPramide (REGLAN) injection  5 mg Intravenous Q6H  . pantoprazole (PROTONIX) IV  40 mg Intravenous Q24H   Continuous Infusions: . sodium chloride 50 mL/hr at 08/10/19 0255  . cefTRIAXone (ROCEPHIN)  IV Stopped (08/09/19 1802)  . fluconazole (DIFLUCAN) IV 100 mg (08/10/19 0919)     Assessment/Plan:  1. Acute hypoxic respiratory failure.  Patient was on high flow nasal cannula and now tapered off oxygen altogether and is currently on room air. 2. Aspiration pneumonia from tube feeds.  Severe malnutrition in context with illness.  Switch antibiotics over to Rocephin to cover Citrobacter in the sputum culture.  Increase tube feeds to three quarters of a can 4 times a day.  Will speak with dietitian tomorrow about his goal rate.  Continue Reglan.  So far tolerated as of this morning.  Continue to monitor closely. 3. Hyponatremia.  Improved with IV fluid hydration 4. Yeast seen on tracheal aspirate.  Started Diflucan.  Since patient has vomiting maybe does have Candida esophagitis.  Continue Diflucan for now. 5. 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 GERD on Protonix 7. History of throat cancer with dysphagia. 8. Mediastinal adenopathy.  Recommend PET CT scan as outpatient for further evaluation.  Code Status:     Code Status Orders  (From admission, onward)         Start     Ordered   08/05/19 2010  Full code  Continuous     08/05/19 2019        Code Status History    Date Active Date Inactive Code Status Order ID Comments User Context   09/08/2018 0404 09/09/2018 1913 Full Code [id number]  Mark Foreman, MD Inpatient   Advance Care Planning Activity    Advance Directive Documentation     Most Recent Value  Type of Advance Directive  Healthcare Power of Attorney, Living will  Pre-existing out of facility DNR order (yellow form or pink MOST form)  --  "MOST" Form in Place?  --     Family Communication: Spoke with the patient's wife on the phone Disposition Plan: We will need to tolerate tube feedings at goal rate prior to disposition.  Antibiotics:  Rocephin  Diflucan  Time spent: 26 minutes.  [place]  [facility] [employer]

## 2019-08-11 MED ORDER — OSMOLITE 1.5 CAL PO LIQD
240.0000 mL | Freq: Every day | ORAL | Status: DC
  Administered 2019-08-11 – 2019-08-12 (×5): 237 mL
  Administered 2019-08-12 (×2): 240 mL

## 2019-08-11 MED ORDER — OSMOLITE 1.5 CAL PO LIQD
240.0000 mL | Freq: Four times a day (QID) | ORAL | Status: DC
  Administered 2019-08-11: 240 mL

## 2019-08-11 MED ORDER — FREE WATER
60.0000 mL | Freq: Every day | Status: DC
  Administered 2019-08-11 – 2019-08-12 (×7): 60 mL

## 2019-08-11 NOTE — Progress Notes (Signed)
Patient ID: Mark Andrews, male   DOB: 1945/10/15, 73 y.o.   MRN: [id number] [facility] PROGRESS NOTE  Mark Andrews [id number] DOB: [birth year] DOA: 08/05/2019 PCP: System, Pcp Not In  HPI/Subjective: Patient feeling better and breathing better.  Patient has been deferring Reglan at this time.  He felt okay and wanted to go up on his tube feedings.  Objective: Vitals:   08/11/19 1139 08/11/19 1430  BP: 109/80   Pulse: 85   Resp:  18  Temp: 98.1 F (36.7 C)   SpO2: 95%     Intake/Output Summary (Last 24 hours) at 08/11/2019 1505 Last data filed at 08/11/2019 1210 Gross per 24 hour  Intake 210 ml  Output 430 ml  Net -220 ml   Filed Weights   08/05/19 1431 08/07/19 0300  Weight: 68.9 kg 69.9 kg    ROS: Review of Systems  Constitutional: Negative for chills and fever.  Eyes: Negative for blurred vision.  Respiratory: Positive for cough. Negative for shortness of breath.   Cardiovascular: Negative for chest pain.  Gastrointestinal: Negative for abdominal pain, constipation, diarrhea, nausea and vomiting.  Genitourinary: Negative for dysuria.  Musculoskeletal: Negative for joint pain.  Neurological: Negative for dizziness and headaches.   Exam: Physical Exam  Constitutional: He is oriented to person, place, and time.  HENT:  Nose: No mucosal edema.  Mouth/Throat: No oropharyngeal exudate or posterior oropharyngeal edema.  Eyes: Pupils are equal, round, and reactive to light. Conjunctivae, EOM and lids are normal.  Neck: No JVD present. Carotid bruit is not present. No thyroid mass and no thyromegaly present.  Cardiovascular: S1 normal and S2 normal. Exam reveals no gallop.  No murmur heard. Pulses:      Dorsalis pedis pulses are 2+ on the right side and 2+ on the left side.  Respiratory: No respiratory distress. He has no decreased breath sounds. He has no wheezes. He has no rhonchi. He has no rales.  Coarse breath sounds throughout the lungs.  GI: Soft. Bowel  sounds are normal. There is no abdominal tenderness.  Musculoskeletal:     Cervical back: No edema.     Right ankle: No swelling.     Left ankle: No swelling.  Lymphadenopathy:    He has no cervical adenopathy.  Neurological: He is alert and oriented to person, place, and time. No cranial nerve deficit.  Skin: Skin is warm. No rash noted. Nails show no clubbing.  Psychiatric: He has a normal mood and affect.      Data Reviewed: Basic Metabolic Panel: Recent Labs  Lab 08/05/19 1437 08/06/19 0620 08/08/19 0659 08/09/19 0404  NA 128* 130* 135 135  K 4.6 4.4 4.0 3.8  CL 88* 100 99 98  CO2 28 27 25 26   GLUCOSE 108* 101* 94 85  BUN 18 13 17 14   CREATININE 0.94 0.60* 0.80 0.71  CALCIUM 8.8* 8.6* 8.9 8.3*   Liver Function Tests: Recent Labs  Lab 08/06/19 0620  AST 24  ALT 31  ALKPHOS 143*  BILITOT 0.7  PROT 7.1  ALBUMIN 2.8*   CBC: Recent Labs  Lab 08/05/19 1437 08/06/19 0620 08/08/19 0659 08/09/19 0404  WBC 11.9* 10.3 12.2* 10.4  NEUTROABS 9.1*  --   --   --   HGB 13.8 12.4* 12.3* 12.1*  HCT 41.3 35.9* 36.9* 35.9*  MCV 90.2 87.1 90.2 88.9  PLT 361 285 365 373   BNP (last 3 results) Recent Labs    09/07/18 2258  BNP 57.0  Scheduled Meds: . budesonide (PULMICORT) nebulizer solution  0.5 mg Nebulization BID  . Chlorhexidine Gluconate Cloth  6 each Topical Daily  . enoxaparin (LOVENOX) injection  40 mg Subcutaneous Q24H  . feeding supplement (OSMOLITE 1.5 CAL)  240 mL Per Tube 6 X Daily  . free water  60 mL Per Tube 6 X Daily  . hydrocortisone cream   Topical TID  . levothyroxine  75 mcg Per Tube Q0600  . mouth rinse  15 mL Mouth Rinse BID  . metoCLOPramide (REGLAN) injection  5 mg Intravenous Q6H  . pantoprazole (PROTONIX) IV  40 mg Intravenous Q24H   Continuous Infusions: . cefTRIAXone (ROCEPHIN)  IV Stopped (08/10/19 1843)  . fluconazole (DIFLUCAN) IV 100 mg (08/11/19 0932)    Assessment/Plan:  1. Aspiration pneumonia from tube feeds.   Severe malnutrition in context with illness.  Switch antibiotics over to Rocephin to cover Citrobacter in the sputum culture.  Increase tube feeds to 1 can 6 times a day.  Notify the patient was not wanting the Reglan.  I discontinued it.  If patient tolerates tube feeds today may be able to go home tomorrow. 2. Acute hypoxic respiratory failure secondary to aspiration pneumonia.  Patient initially required high flow nasal cannula and now breathing on room air.  Likely will need inhalers upon going home. 3. Hyponatremia.  Improved with IV fluid hydration 4. Yeast seen on tracheal aspirate.  Started Diflucan.  Since patient has vomiting maybe does have Candida esophagitis.  Continue Diflucan for now. 5. 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 GERD on Protonix 7. History of throat cancer with dysphagia. 8. Mediastinal adenopathy.  Recommend PET CT scan as outpatient for further evaluation.  Code Status:     Code Status Orders  (From admission, onward)         Start     Ordered   08/05/19 2010  Full code  Continuous     08/05/19 2019        Code Status History    Date Active Date Inactive Code Status Order ID Comments User Context   09/08/2018 0404 09/09/2018 1913 Full Code [id number]  Mark Foreman, MD Inpatient   Advance Care Planning Activity    Advance Directive Documentation     Most Recent Value  Type of Advance Directive  Healthcare Power of Attorney, Living will  Pre-existing out of facility DNR order (yellow form or pink MOST form)  --  "MOST" Form in Place?  --     Family Communication: Spoke with the patient's wife on the phone Disposition Plan: If tolerates tube feeds at goal rate today may be able to get out of the hospital tomorrow.  Antibiotics:  Rocephin  Diflucan  Time spent: 27 minutes.  Mark Andrews  [facility] [employer]

## 2019-08-11 NOTE — Care Management Important Message (Signed)
Important Message  Patient Details  Name: Mark Andrews MRN: [id number] Date of Birth: 06-16-1946   Medicare Important Message Given:  Yes     Dannette Barbara 08/11/2019, 3:41 PM

## 2019-08-12 MED ORDER — PROMETHAZINE HCL 25 MG RE SUPP: 25.0000 mg | Freq: Three times a day (TID) | RECTAL | 0 refills | Status: AC | PRN

## 2019-08-12 MED ORDER — CEFDINIR 300 MG PO CAPS: 300.0000 mg | ORAL_CAPSULE | Freq: Two times a day (BID) | ORAL | 0 refills | Status: AC

## 2019-08-12 MED ORDER — OSMOLITE 1.5 CAL PO LIQD: 240.0000 mL | Freq: Every day | ORAL | 11 refills | Status: DC

## 2019-08-12 MED ORDER — OSMOLITE 1.5 CAL PO LIQD: 240.0000 mL | Freq: Every day | ORAL | 11 refills | Status: AC

## 2019-08-12 MED ORDER — FLOVENT HFA 220 MCG/ACT IN AERO: 1.0000 | INHALATION_SPRAY | Freq: Two times a day (BID) | RESPIRATORY_TRACT | 0 refills | Status: AC

## 2019-08-12 MED ORDER — HYDROCORTISONE 1 % EX CREA: TOPICAL_CREAM | Freq: Three times a day (TID) | CUTANEOUS | 0 refills | Status: AC

## 2019-08-12 MED ORDER — FREE WATER: 60.0000 mL | Freq: Every day | 0 refills | Status: AC

## 2019-08-12 MED ORDER — COMBIVENT RESPIMAT 20-100 MCG/ACT IN AERS: 1.0000 | INHALATION_SPRAY | Freq: Four times a day (QID) | RESPIRATORY_TRACT | 0 refills | Status: AC

## 2019-08-12 MED ORDER — ONDANSETRON HCL 4 MG PO TABS: 4.0000 mg | ORAL_TABLET | Freq: Three times a day (TID) | ORAL | 0 refills | Status: AC | PRN

## 2019-08-12 NOTE — TOC Progression Note (Signed)
Transition of Care [facility]) - Progression Note    Patient Details  Name: Mark Andrews MRN: [id number] Date of Birth: 1945/12/14  [facility] CM/SW Contact  Beverly Sessions, RN Phone Number: 08/12/2019, 2:08 PM  Clinical Narrative:     Patient to discharge home today Patient resides in [place].    Dietician has spoken with patients company that he works with [employer] [phone].  She was informed that she would ship supplies to the patient here in [place], as they have done in the past.    Prescription faxed to [facility].    per dietician hospital will send patient home with 6 day supply, to cover him until his shipment comes in     Expected Discharge Plan and Services           Expected Discharge Date: 08/12/19                                     Social Determinants of Health (SDOH) Interventions    Readmission Risk Interventions No flowsheet data found.

## 2019-08-12 NOTE — Progress Notes (Signed)
Mark Andrews  A and O x 4. VSS. Pt tolerating diet well. No complaints of pain or nausea. IV removed intact, prescriptions given. Pt voiced understanding of discharge instructions with no further questions. Pt discharged via wheelchair with NT.     Allergies as of 08/12/2019   No Known Allergies     Medication List    TAKE these medications   cefdinir 300 MG capsule Commonly known as: OMNICEF Place 1 capsule (300 mg total) into feeding tube 2 (two) times daily for 3 doses. Notes to patient: 08/12/19   Combivent Respimat 20-100 MCG/ACT Aers respimat Generic drug: Ipratropium-Albuterol Inhale 1 puff into the lungs every 6 (six) hours.   feeding supplement (OSMOLITE 1.5 CAL) Liqd Place 240 mLs into feeding tube 6 (six) times daily.   Flovent HFA 220 MCG/ACT inhaler Generic drug: fluticasone Inhale 1 puff into the lungs 2 (two) times daily.   free water Soln Place 60 mLs into feeding tube 6 (six) times daily. Notes to patient: With tube feedings    hydrocortisone cream 1 % Apply topically 3 (three) times daily. Apply to back three times a day   levothyroxine 75 MCG tablet Commonly known as: SYNTHROID Take 75 mcg by mouth daily before breakfast.   ondansetron 4 MG tablet Commonly known as: Zofran Place 1 tablet (4 mg total) into feeding tube every 8 (eight) hours as needed for nausea or vomiting.   pantoprazole 40 MG tablet Commonly known as: PROTONIX Take 40 mg by mouth daily.   promethazine 25 MG suppository Commonly known as: Phenergan Place 1 suppository (25 mg total) rectally every 8 (eight) hours as needed for vomiting.   sodium fluoride 1.1 % Gel dental gel Commonly known as: FLUORISHIELD Place 1 application onto teeth daily.       Vitals:   08/12/19 0404 08/12/19 0834  BP: (!) 149/91   Pulse: 78   Resp: 20   Temp: 97.8 F (36.6 C)   SpO2: 95% 95%    Francesco Sor

## 2019-08-12 NOTE — Discharge Instructions (Signed)
Aspiration Pneumonia °Aspiration pneumonia is an infection in the lungs. It occurs when saliva or liquid contaminated with bacteria is inhaled (aspirated) into the lungs. When these things get into the lungs, swelling (inflammation) and infection can occur. This can make it difficult to breathe. Aspiration pneumonia is a serious condition and can be life threatening. °What are the causes? °This condition is caused when saliva or liquid from the mouth, throat, or stomach is inhaled into the lungs, and when those fluids are contaminated with bacteria. °What increases the risk? °The following factors may make you more likely to develop this condition: °· A narrowing of the tube that carries food to the stomach (esophageal narrowing). °· Having gastroesophageal reflux disease (GERD). °· Having a weak immune system. °· Having diabetes. °· Having poor oral hygiene. °· Being malnourished. °The condition is more likely to occur when a person's cough (gag) reflex, or ability to swallow, has decreased. Some things that can cause this decrease include: °· Having a brain injury or disease, such as stroke, seizures, Parkinson disease, dementia, or amyotrophic lateral sclerosis (ALS). °· Being given a general anesthetic for procedures. °· Drinking too much alcohol. If a person passes out and vomits, vomit can be inhaled into the lungs. °· Taking certain medicines, such as tranquilizers or sedatives. °What are the signs or symptoms? °Symptoms of this condition include: °· Fever. °· A cough with secretions that are yellow, tan, or green. °· Breathing problems, such as wheezing or shortness of breath. °· Chest pain. °· Being more tired than usual (fatigue). °· Having a history of coughing while eating or drinking. °· Bad breath. °· Bluish color to the lips, skin, or fingers. °How is this diagnosed? °This condition may be diagnosed based on: °· A physical exam. °· Tests, such as: °? Chest X-ray. °? Sputum culture. Saliva and mucus  (sputum) are collected from the lungs or the tubes that carry air to the lungs (bronchi). The sputum is then tested for bacteria. °? Oximetry. A sensor or clip is placed on areas such as a finger, earlobe, or toe to measure the oxygen level in your blood. °? Blood tests. °? Swallowing study. This test looks at how food is swallowed and whether it goes into your breathing tube (trachea) or esophagus. °? Bronchoscopy. This test uses a flexible tube (bronchoscope) to see inside the lungs. °How is this treated? °This condition may be treated with: °· Medicines. Antibiotic medicine will be given to kill the pneumonia bacteria. Other medicines may also be used to reduce fever or pain. °· Breathing assistance and oxygen therapy. Depending on how well you are breathing, you may need to be given oxygen, or you may need breathing support from a breathing machine (ventilator). °· Thoracentesis. This is a procedure to remove fluid that has built up in the space between the linings of the chest wall and the lungs. °· Feeding tube and diet change. For people who have difficulty swallowing, a feeding tube might be placed in the stomach, or they may be asked to avoid certain food textures or liquids when eating. °Follow these instructions at home: °Medicines °· Take over-the-counter and prescription medicines only as told by your health care provider. °? If you were prescribed an antibiotic medicine, take it as told by your health care provider. Do not stop taking the antibiotic even if you start to feel better. °? Take cough medicine only if you are losing sleep. Cough medicine can prevent your body’s natural ability to remove mucus   from your lungs. °General instructions °· Carefully follow any eating instructions you were given, such as avoiding certain food textures or thickening your liquids. Thickening liquids reduces the risk of developing aspiration pneumonia again. °· Use breathing exercises such as postural drainage, deep  breathing, and incentive spirometry to help expel secretions. °· Rest as instructed by your health care provider. °· Sleep in a semi-upright position at night. Try to sleep in a reclining chair, or place a few pillows under your head. °· Do not use any products that contain nicotine or tobacco, such as cigarettes and e-cigarettes. If you need help quitting, ask your health care provider. °· Keep all follow-up visits as told by your health care provider. This is important. °Contact a health care provider if: °· You have a fever. °· You have a worsening cough with yellow, tan, or green secretions. °· You have coughing while eating or drinking. °Get help right away if: °· You have worsening shortness of breath, wheezing, or difficulty breathing. °· You have chest pain. °Summary °· Aspiration pneumonia is an infection in the lungs. It is caused when saliva or liquid from the mouth, throat, or stomach is inhaled into the lungs. °· Aspiration pneumonia is more likely to occur when a person's cough reflex or ability to swallow has decreased. °· Symptoms of aspiration pneumonia include coughing, breathing problems, fever, and chest pain. °· Aspiration pneumonia may be treated with antibiotic medicine, other medicines to reduce pain or fever, and breathing assistance or oxygen therapy. °This information is not intended to replace advice given to you by your health care provider. Make sure you discuss any questions you have with your health care provider. °Document Released: 06/11/2009 Document Revised: 07/27/2017 Document Reviewed: 09/19/2016 °Elsevier Patient Education © 2020 [employer] ° °

## 2019-08-12 NOTE — Discharge Summary (Signed)
[place] [facility] [place] NAME: Mark Andrews    MR#:  [id number]  DATE OF BIRTH:  1946-02-25  DATE OF ADMISSION:  08/05/2019 ADMITTING PHYSICIAN: Christel Mormon, MD  DATE OF DISCHARGE: 08/12/2019  PRIMARY CARE PHYSICIAN: System, Pcp Not In    ADMISSION DIAGNOSIS:  Nausea & vomiting [R11.2] Hypoxia [R09.02] Recurrent aspiration pneumonia ([place]) [J69.0] Aspiration pneumonia of right lung, unspecified aspiration pneumonia type, unspecified part of lung ([place]) [J69.0]  DISCHARGE DIAGNOSIS:  Active Problems:   Aspiration pneumonia (HCC)   Hyponatremia   Hypothyroidism   Gastroesophageal reflux disease without esophagitis   Nausea and vomiting   Acute respiratory failure with hypoxia (HCC)   Mediastinal lymphadenopathy   Protein-calorie malnutrition, severe   SECONDARY DIAGNOSIS:   Past Medical History:  Diagnosis Date  . Aspiration into respiratory tract   . Cancer (HCC)    throat  . GERD (gastroesophageal reflux disease)   . Hypothyroidism     HOSPITAL COURSE:   1.  Aspiration pneumonia from tube feeds.  The patient had a second aspiration event in the hospital.  Patient has severe malnutrition in context with illness.  The patient had Citrobacter growing in the sputum culture so Unasyn was switched over to Rocephin, once culture came back.  I will give a total of 5 days of Rocephin and Omnicef total.  The patient is now tolerating tube feeds 1 can 6 times a day. 2.  Acute hypoxic respiratory failure secondary to aspiration pneumonia.  The patient initially required high flow nasal cannula.  He is now breathing comfortably on room air.  I will prescribe Combivent and Flovent upon discharge home.  Advised to rinse out mouth after using Flovent. 3.  Hyponatremia.  This improved with IV fluid hydration. 4.  Yeast seen on tracheal aspirate.  Patient started on Diflucan.  Completed course while here. 5.  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  GERD  on Protonix 7.  History of throat cancer with dysphagia.  On PEG feeding tubes for nutrition. 8.  Mediastinal adenopathy.  Recommend PET CT scan as outpatient for further evaluation.  DISCHARGE CONDITIONS:   Satisfactory  CONSULTS OBTAINED:  Critical care specialist Gastroenterologist Dietitian  DRUG ALLERGIES:  No Known Allergies  DISCHARGE MEDICATIONS:   Allergies as of 08/12/2019   No Known Allergies     Medication List    TAKE these medications   cefdinir 300 MG capsule Commonly known as: OMNICEF Place 1 capsule (300 mg total) into feeding tube 2 (two) times daily for 3 doses. Notes to patient: 08/12/19   Combivent Respimat 20-100 MCG/ACT Aers respimat Generic drug: Ipratropium-Albuterol Inhale 1 puff into the lungs every 6 (six) hours.   feeding supplement (OSMOLITE 1.5 CAL) Liqd Place 240 mLs into feeding tube 6 (six) times daily.   Flovent HFA 220 MCG/ACT inhaler Generic drug: fluticasone Inhale 1 puff into the lungs 2 (two) times daily.   free water Soln Place 60 mLs into feeding tube 6 (six) times daily. Notes to patient: With tube feedings    hydrocortisone cream 1 % Apply topically 3 (three) times daily. Apply to back three times a day   levothyroxine 75 MCG tablet Commonly known as: SYNTHROID Take 75 mcg by mouth daily before breakfast.   ondansetron 4 MG tablet Commonly known as: Zofran Place 1 tablet (4 mg total) into feeding tube every 8 (eight) hours as needed for nausea or vomiting.   pantoprazole 40 MG tablet Commonly known as: PROTONIX Take  40 mg by mouth daily.   promethazine 25 MG suppository Commonly known as: Phenergan Place 1 suppository (25 mg total) rectally every 8 (eight) hours as needed for vomiting.   sodium fluoride 1.1 % Gel dental gel Commonly known as: FLUORISHIELD Place 1 application onto teeth daily.        DISCHARGE INSTRUCTIONS:   We will set up with primary care physician in the area Patient lives most  of the time in [place]  If you experience worsening of your admission symptoms, develop shortness of breath, life threatening emergency, suicidal or homicidal thoughts you must seek medical attention immediately by calling 911 or calling your MD immediately  if symptoms less severe.  You Must read complete instructions/literature along with all the possible adverse reactions/side effects for all the Medicines you take and that have been prescribed to you. Take any new Medicines after you have completely understood and accept all the possible adverse reactions/side effects.   Please note  You were cared for by a hospitalist during your hospital stay. If you have any questions about your discharge medications or the care you received while you were in the hospital after you are discharged, you can call the unit and asked to speak with the hospitalist on call if the hospitalist that took care of you is not available. Once you are discharged, your primary care physician will handle any further medical issues. Please note that NO REFILLS for any discharge medications will be authorized once you are discharged, as it is imperative that you return to your primary care physician (or establish a relationship with a primary care physician if you do not have one) for your aftercare needs so that they can reassess your need for medications and monitor your lab values.    Today   CHIEF COMPLAINT:   Chief Complaint  Patient presents with  . Shortness of Breath  . Emesis    HISTORY OF PRESENT ILLNESS:  Mark Andrews  is a 73 y.o. male came in with shortness of breath and vomiting   VITAL SIGNS:  Blood pressure (!) 149/91, pulse 78, temperature 97.8 F (36.6 C), temperature source Oral, resp. rate 20, height 5\' 11"  (1.803 m), weight 69.9 kg, SpO2 95 %.   PHYSICAL EXAMINATION:  GENERAL:  73 y.o.-year-old patient lying in the bed with no acute distress.  EYES: Pupils equal, round, reactive to light and  accommodation. No scleral icterus. Extraocular muscles intact.  HEENT: Head atraumatic, normocephalic. Oropharynx and nasopharynx clear.  NECK:  Supple, no jugular venous distention. No thyroid enlargement, no tenderness.  LUNGS: Decreased breath sounds bilaterally.  Coarse breath sounds on the right.  No use of accessory muscles of respiration.  CARDIOVASCULAR: S1, S2 normal. No murmurs, rubs, or gallops.  ABDOMEN: Soft, non-tender, non-distended. Bowel sounds present. No organomegaly or mass.  EXTREMITIES: No pedal edema, cyanosis, or clubbing.  NEUROLOGIC: Cranial nerves II through XII are intact. Muscle strength 5/5 in all extremities. Sensation intact. Gait not checked.  PSYCHIATRIC: The patient is alert and oriented x 3.  SKIN: No obvious rash, lesion, or ulcer.   DATA REVIEW:   CBC Recent Labs  Lab 08/09/19 0404  WBC 10.4  HGB 12.1*  HCT 35.9*  PLT 373    Chemistries  Recent Labs  Lab 08/06/19 0620 08/09/19 0404  NA 130* 135  K 4.4 3.8  CL 100 98  CO2 27 26  GLUCOSE 101* 85  BUN 13 14  CREATININE 0.60* 0.71  CALCIUM 8.6* 8.3*  AST 24  --   ALT 31  --   ALKPHOS 143*  --   BILITOT 0.7  --      Microbiology Results  Results for orders placed or performed during the hospital encounter of 08/05/19  Urine culture     Status: Abnormal   Collection Time: 08/05/19  6:36 PM   Specimen: In/Out Cath Urine  Result Value Ref Range Status   Specimen Description   Final    IN/OUT CATH URINE Performed at [facility], [address] [place], [place] [address]    Special Requests   Final    NONE Performed at [facility], [address] [place], [place] [address]    Culture 100 COLONIES/mL STAPHYLOCOCCUS HAEMOLYTICUS (A)  Final   Report Status 08/08/2019 FINAL  Final   Organism ID, Bacteria STAPHYLOCOCCUS HAEMOLYTICUS (A)  Final      Susceptibility   Staphylococcus haemolyticus - MIC*    CIPROFLOXACIN <=0.5 SENSITIVE Sensitive     GENTAMICIN  <=0.5 SENSITIVE Sensitive     NITROFURANTOIN <=16 SENSITIVE Sensitive     OXACILLIN >=4 RESISTANT Resistant     TETRACYCLINE >=16 RESISTANT Resistant     VANCOMYCIN <=0.5 SENSITIVE Sensitive     TRIMETH/SULFA <=10 SENSITIVE Sensitive     CLINDAMYCIN <=0.25 SENSITIVE Sensitive     RIFAMPIN <=0.5 SENSITIVE Sensitive     Inducible Clindamycin NEGATIVE Sensitive     * 100 COLONIES/mL STAPHYLOCOCCUS HAEMOLYTICUS  Blood Culture (routine x 2)     Status: None   Collection Time: 08/05/19  6:47 PM   Specimen: BLOOD  Result Value Ref Range Status   Specimen Description BLOOD RIGHT ANTECUBITAL  Final   Special Requests   Final    BOTTLES DRAWN AEROBIC AND ANAEROBIC Blood Culture adequate volume   Culture   Final    NO GROWTH 5 DAYS Performed at [facility], [address], [place], [place] [address]    Report Status 08/10/2019 FINAL  Final  Blood Culture (routine x 2)     Status: None   Collection Time: 08/05/19  6:47 PM   Specimen: BLOOD  Result Value Ref Range Status   Specimen Description BLOOD BLOOD LEFT FOREARM  Final   Special Requests   Final    BOTTLES DRAWN AEROBIC AND ANAEROBIC Blood Culture adequate volume   Culture   Final    NO GROWTH 5 DAYS Performed at [facility], [address] [place], [place] [address]    Report Status 08/10/2019 FINAL  Final  SARS CORONAVIRUS 2 (TAT 6-24 HRS) Nasopharyngeal Nasopharyngeal Swab     Status: None   Collection Time: 08/05/19  8:33 PM   Specimen: Nasopharyngeal Swab  Result Value Ref Range Status   SARS Coronavirus 2 NEGATIVE NEGATIVE Final    Comment: (NOTE) SARS-CoV-2 target nucleic acids are NOT DETECTED. The SARS-CoV-2 RNA is generally detectable in upper and lower respiratory specimens during the acute phase of infection. Negative results do not preclude SARS-CoV-2 infection, do not rule out co-infections with other pathogens, and should not be used as the sole basis for treatment or other patient  management decisions. Negative results must be combined with clinical observations, patient history, and epidemiological information. The expected result is Negative. Fact Sheet for Patients: SugarRoll.be Fact Sheet for Healthcare Providers: https://www.woods-mathews.com/ This test is not yet approved or cleared by the [place] FDA and  has been authorized for detection and/or diagnosis of SARS-CoV-2 by FDA under an Emergency Use Authorization (  EUA). This EUA will remain  in effect (meaning this test can be used) for the duration of the COVID-19 declaration under Section 56 4(b)(1) of the Act, 21 U.S.C. section 360bbb-3(b)(1), unless the authorization is terminated or revoked sooner. Performed at [facility], [place] [address], [place], [place] [address]   Culture, sputum-assessment     Status: None   Collection Time: 08/05/19  8:33 PM   Specimen: Sputum  Result Value Ref Range Status   Specimen Description SPUTUM  Final   Special Requests NONE  Final   Sputum evaluation   Final    THIS SPECIMEN IS ACCEPTABLE FOR SPUTUM CULTURE Performed at [facility], [address], [place], [place] [address]    Report Status 08/05/2019 FINAL  Final  Culture, respiratory     Status: None   Collection Time: 08/05/19  8:33 PM   Specimen: SPU  Result Value Ref Range Status   Specimen Description   Final    SPUTUM Performed at [facility], [address], [place], [place] [address]    Special Requests   Final    NONE Reflexed from [phone] Performed at [facility], [address] [place], [place] [address]    Gram Stain   Final    MODERATE WBC PRESENT, PREDOMINANTLY PMN MODERATE GRAM POSITIVE COCCI IN CLUSTERS IN CHAINS FEW GRAM NEGATIVE RODS FEW GRAM POSITIVE RODS FEW BUDDING YEAST SEEN Performed at [facility], [place] [address], [place], [place] [address]    Culture ABUNDANT Su [place]   Final   Report Status 08/09/2019 FINAL  Final   Organism ID, Bacteria CITROBACTER YOUNGAE  Final      Susceptibility   Citrobacter youngae - MIC*    CEFAZOLIN >=64 RESISTANT Resistant     CEFEPIME <=1 SENSITIVE Sensitive     CEFTAZIDIME <=1 SENSITIVE Sensitive     CEFTRIAXONE <=1 SENSITIVE Sensitive     CIPROFLOXACIN <=0.25 SENSITIVE Sensitive     GENTAMICIN <=1 SENSITIVE Sensitive     IMIPENEM <=0.25 SENSITIVE Sensitive     TRIMETH/SULFA <=20 SENSITIVE Sensitive     PIP/TAZO <=4 SENSITIVE Sensitive     * ABUNDANT CITROBACTER YOUNGAE  MRSA PCR Screening     Status: None   Collection Time: 08/07/19  2:00 AM   Specimen: Nasopharyngeal  Result Value Ref Range Status   MRSA by PCR NEGATIVE NEGATIVE Final    Comment:        The GeneXpert MRSA Assay (FDA approved for NASAL specimens only), is one component of a comprehensive MRSA colonization surveillance program. It is not intended to diagnose MRSA infection nor to guide or monitor treatment for MRSA infections. Performed at [facility], [address], [place], [place] [address]       Management plans discussed with the patient, family and they are in agreement.  CODE STATUS:     Code Status Orders  (From admission, onward)         Start     Ordered   08/05/19 2010  Full code  Continuous     08/05/19 2019        Code Status History    Date Active Date Inactive Code Status Order ID Comments User Context   09/08/2018 0404 09/09/2018 1913 Full Code [id number]  Harrie Foreman, MD Inpatient   Advance Care Planning Activity    Advance Directive Documentation     Most Recent Value  Type of Advance Directive  Healthcare Power  of Attorney, Living will  Pre-existing out of facility DNR order (yellow form or pink MOST form)  --  "MOST" Form in Place?  --      TOTAL TIME TAKING CARE OF THIS PATIENT: 35 minutes.    Loletha Grayer M.D on 08/12/2019 at 4:19 PM  Between 7am to 6pm - Pager -  [phone]  After 6pm go to [email or web address] - password [email or web address]  [facility]  CC: Primary care physician; System, Pcp Not In

## 2019-08-18 LAB — BLOOD GAS, ARTERIAL
Acid-Base Excess: 2.3 mmol/L — ABNORMAL HIGH (ref 0.0–2.0)
Bicarbonate: 26.5 mmol/L (ref 20.0–28.0)
FIO2: 0.4
O2 Saturation: 87.8 %
Patient temperature: 37
pCO2 arterial: 39 mmHg (ref 32.0–48.0)
pH, Arterial: 7.44 (ref 7.350–7.450)
pO2, Arterial: 52 mmHg — ABNORMAL LOW (ref 83.0–108.0)

## 2020-09-25 IMAGING — DX DG ABD PORTABLE 2V
2 series · 2 of 2 positions shown · non-contrast
Comparison: Single-view of the chest 08/06/2019.

CLINICAL DATA: Nausea and vomiting.

EXAM:
PORTABLE ABDOMEN - 2 VIEW

[abdomen erect]
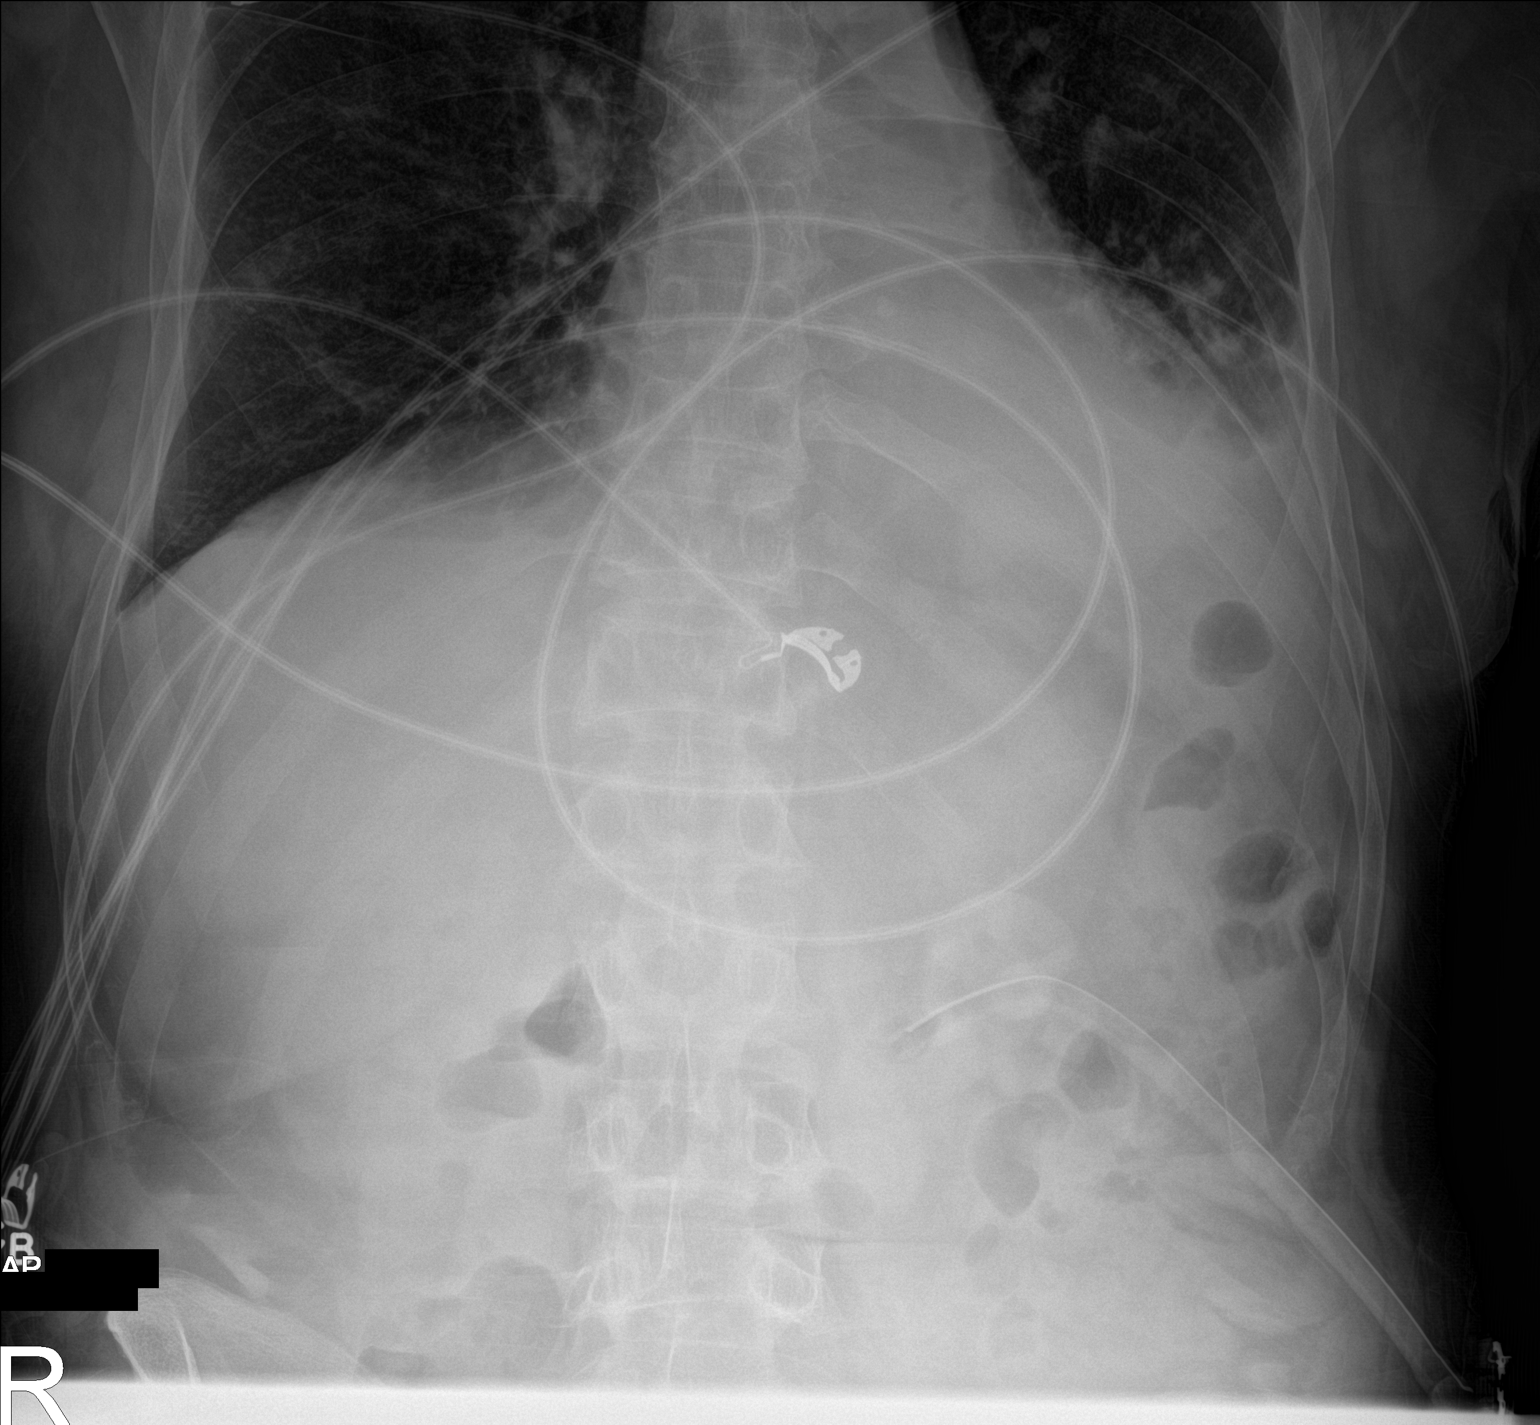

[abdomen supine]
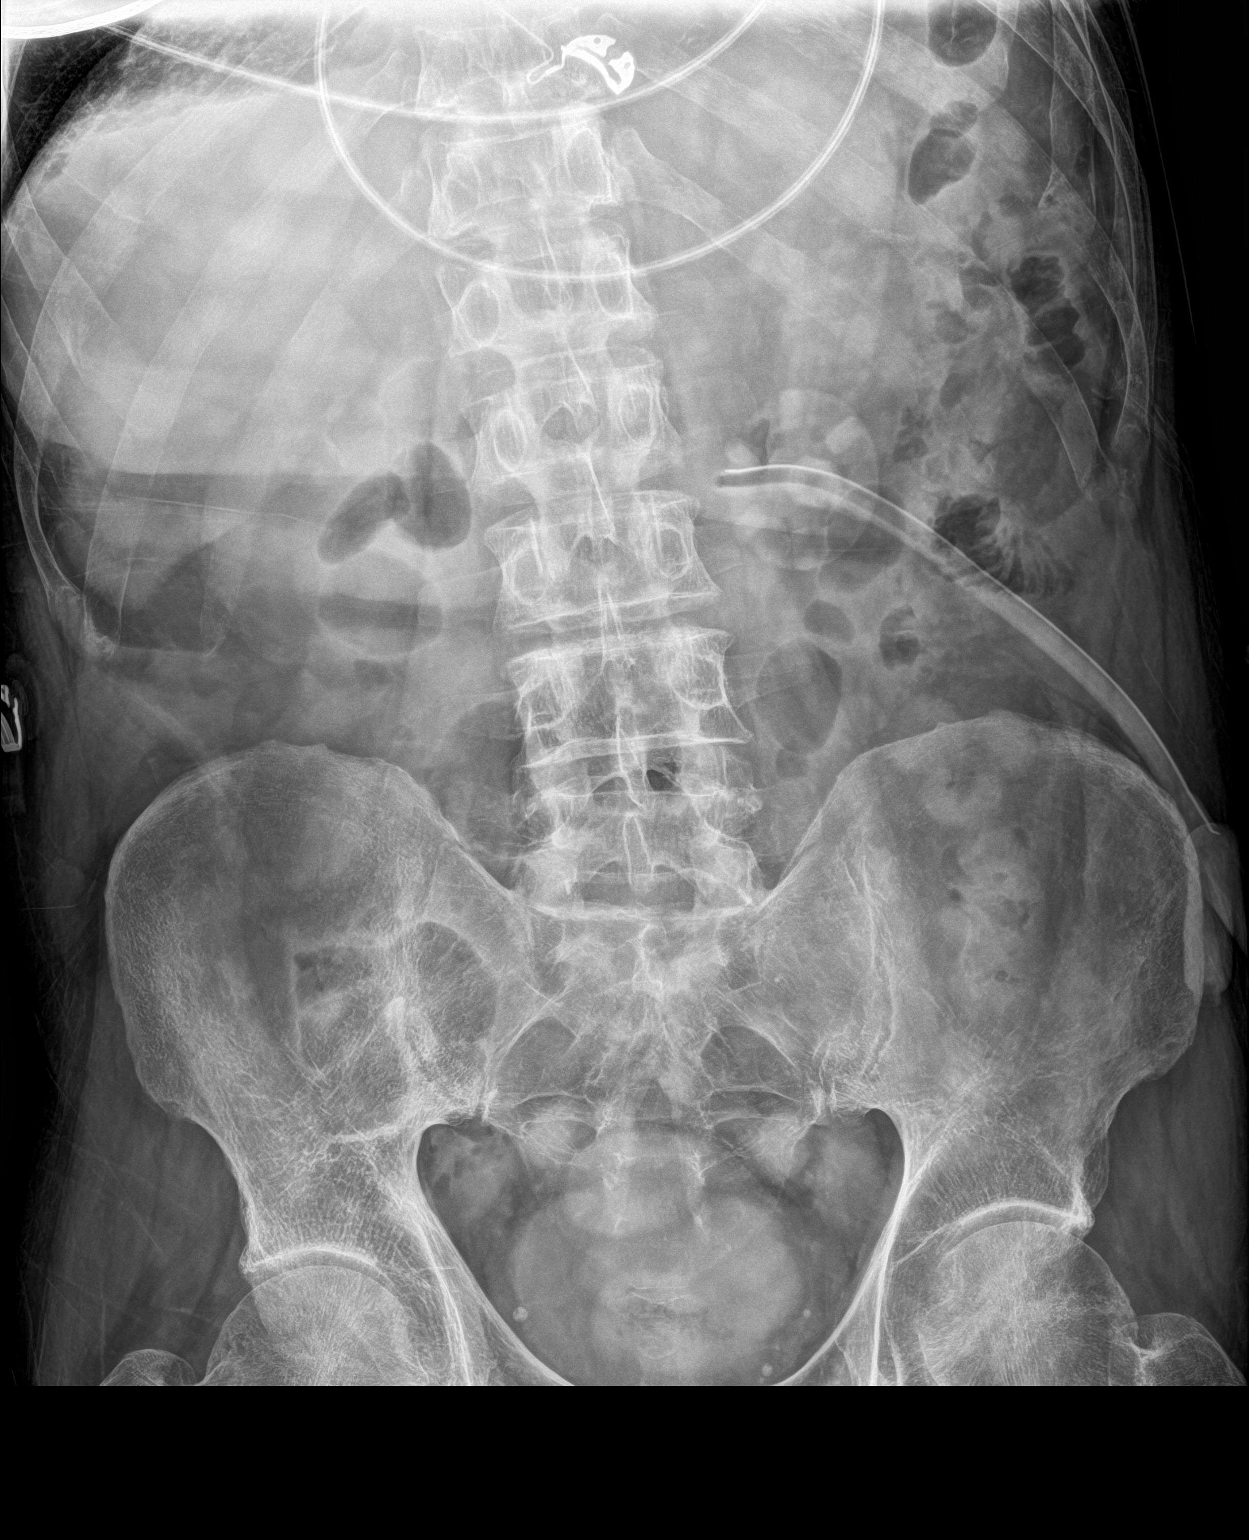

[2 of 2 positions shown; findings below may reference images not displayed]

FINDINGS: Feeding tube is in place. Bowel gas pattern is nonobstructive. No
free intraperitoneal air. There is a small left pleural effusion and
left basilar airspace disease.
IMPRESSION: Negative abdomen.  Feeding tube noted.

Left pleural effusion and basilar airspace disease as seen on chest
film yesterday.

## 2021-06-28 DEATH — deceased
